# Patient Record
Sex: Male | Born: 1955 | Race: Black or African American | Hispanic: No | Smoking: Current every day smoker
Health system: Southern US, Community
[De-identification: ages and names within clinical notes are randomized; demographics above are authoritative.]

## PROBLEM LIST (undated history)

## (undated) DIAGNOSIS — F209 Schizophrenia, unspecified: Secondary | ICD-10-CM

## (undated) DIAGNOSIS — F32A Depression, unspecified: Secondary | ICD-10-CM

## (undated) DIAGNOSIS — H547 Unspecified visual loss: Secondary | ICD-10-CM

## (undated) DIAGNOSIS — F329 Major depressive disorder, single episode, unspecified: Secondary | ICD-10-CM

## (undated) DIAGNOSIS — F1010 Alcohol abuse, uncomplicated: Secondary | ICD-10-CM

## (undated) DIAGNOSIS — I499 Cardiac arrhythmia, unspecified: Secondary | ICD-10-CM

## (undated) DIAGNOSIS — R51 Headache: Secondary | ICD-10-CM

## (undated) DIAGNOSIS — E119 Type 2 diabetes mellitus without complications: Secondary | ICD-10-CM

## (undated) DIAGNOSIS — B1920 Unspecified viral hepatitis C without hepatic coma: Secondary | ICD-10-CM

## (undated) DIAGNOSIS — J45909 Unspecified asthma, uncomplicated: Secondary | ICD-10-CM

## (undated) DIAGNOSIS — F419 Anxiety disorder, unspecified: Secondary | ICD-10-CM

## (undated) DIAGNOSIS — F200 Paranoid schizophrenia: Secondary | ICD-10-CM

## (undated) DIAGNOSIS — H409 Unspecified glaucoma: Secondary | ICD-10-CM

## (undated) DIAGNOSIS — Z720 Tobacco use: Secondary | ICD-10-CM

## (undated) DIAGNOSIS — H269 Unspecified cataract: Secondary | ICD-10-CM

## (undated) DIAGNOSIS — I10 Essential (primary) hypertension: Secondary | ICD-10-CM

## (undated) HISTORY — PX: TOTAL HIP ARTHROPLASTY: SHX124

## (undated) HISTORY — PX: HIP ARTHROPLASTY: SHX981

## (undated) HISTORY — PX: KNEE CARTILAGE SURGERY: SHX688

## (undated) HISTORY — PX: OTHER SURGICAL HISTORY: SHX169

---

## 2011-11-26 ENCOUNTER — Emergency Department (HOSPITAL_COMMUNITY): Payer: Self-pay

## 2011-11-26 ENCOUNTER — Emergency Department (HOSPITAL_COMMUNITY)
Admission: EM | Admit: 2011-11-26 | Discharge: 2011-11-26 | Disposition: A | Discharge status: Home / Self Care | Payer: Self-pay | Attending: Emergency Medicine | Admitting: Emergency Medicine

## 2011-11-26 ENCOUNTER — Encounter (HOSPITAL_COMMUNITY): Payer: Self-pay | Admitting: *Deleted

## 2011-11-26 DIAGNOSIS — S0083XA Contusion of other part of head, initial encounter: Secondary | ICD-10-CM

## 2011-11-26 DIAGNOSIS — S01501A Unspecified open wound of lip, initial encounter: Secondary | ICD-10-CM | POA: Insufficient documentation

## 2011-11-26 DIAGNOSIS — Z8659 Personal history of other mental and behavioral disorders: Secondary | ICD-10-CM | POA: Insufficient documentation

## 2011-11-26 DIAGNOSIS — R404 Transient alteration of awareness: Secondary | ICD-10-CM | POA: Insufficient documentation

## 2011-11-26 DIAGNOSIS — M25559 Pain in unspecified hip: Secondary | ICD-10-CM | POA: Insufficient documentation

## 2011-11-26 DIAGNOSIS — S0003XA Contusion of scalp, initial encounter: Secondary | ICD-10-CM | POA: Insufficient documentation

## 2011-11-26 DIAGNOSIS — S199XXA Unspecified injury of neck, initial encounter: Secondary | ICD-10-CM | POA: Insufficient documentation

## 2011-11-26 DIAGNOSIS — S0993XA Unspecified injury of face, initial encounter: Secondary | ICD-10-CM | POA: Insufficient documentation

## 2011-11-26 HISTORY — DX: Schizophrenia, unspecified: F20.9

## 2011-11-26 LAB — POCT I-STAT, CHEM 8
BUN: 13 mg/dL (ref 6–23)
Calcium, Ion: 1.08 mmol/L — ABNORMAL LOW (ref 1.12–1.32)
Creatinine, Ser: 1.3 mg/dL (ref 0.50–1.35)
TCO2: 25 mmol/L (ref 0–100)

## 2011-11-26 MED ORDER — HYDROCODONE-ACETAMINOPHEN 5-325 MG PO TABS: 1.0000 | ORAL_TABLET | Freq: Four times a day (QID) | ORAL | Status: AC | PRN

## 2011-11-26 MED ORDER — VITAMIN B-1 100 MG PO TABS
100.0000 mg | ORAL_TABLET | Freq: Once | ORAL | Status: AC
  Administered 2011-11-26: 100 mg via ORAL
  Filled 2011-11-26: qty 1

## 2011-11-26 MED ORDER — SODIUM CHLORIDE 0.9 % IV SOLN
INTRAVENOUS | Status: DC
  Administered 2011-11-26: 21:00:00 via INTRAVENOUS
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
## 2012-05-23 ENCOUNTER — Encounter (HOSPITAL_COMMUNITY): Payer: Self-pay | Admitting: Emergency Medicine

## 2012-05-23 ENCOUNTER — Emergency Department (INDEPENDENT_AMBULATORY_CARE_PROVIDER_SITE_OTHER)
Admission: EM | Admit: 2012-05-23 | Discharge: 2012-05-23 | Disposition: A | Discharge status: Home / Self Care | Payer: Self-pay | Source: Home / Self Care | Attending: Emergency Medicine | Admitting: Emergency Medicine

## 2012-05-23 DIAGNOSIS — M545 Low back pain: Secondary | ICD-10-CM

## 2012-05-23 DIAGNOSIS — M25559 Pain in unspecified hip: Secondary | ICD-10-CM

## 2012-05-23 DIAGNOSIS — M25551 Pain in right hip: Secondary | ICD-10-CM

## 2012-05-23 DIAGNOSIS — M533 Sacrococcygeal disorders, not elsewhere classified: Secondary | ICD-10-CM

## 2012-05-23 MED ORDER — KETOROLAC TROMETHAMINE 60 MG/2ML IM SOLN
60.0000 mg | Freq: Once | INTRAMUSCULAR | Status: AC
  Administered 2012-05-23: 60 mg via INTRAMUSCULAR

## 2012-05-23 MED ORDER — KETOROLAC TROMETHAMINE 60 MG/2ML IM SOLN
INTRAMUSCULAR | Status: AC
  Filled 2012-05-23: qty 2

## 2012-05-23 MED ORDER — MELOXICAM 7.5 MG PO TABS: 7.5000 mg | ORAL_TABLET | Freq: Every day | ORAL | Status: DC

## 2012-05-23 MED ORDER — CYCLOBENZAPRINE HCL 10 MG PO TABS: 10.0000 mg | ORAL_TABLET | Freq: Two times a day (BID) | ORAL | Status: DC | PRN

## 2012-05-23 MED ORDER — TRAMADOL HCL 50 MG PO TABS: 50.0000 mg | ORAL_TABLET | Freq: Four times a day (QID) | ORAL | Status: DC | PRN

## 2012-05-23 NOTE — ED Notes (Signed)
Pt c/o bilateral hip pain and pain on left knee x6 months... Does not have a PCP but is seeing a Dealer... Hip surgery in 2004 and 2006... Pt can't walk/sit for long periods... Denies: fevers, vomiting, nauseas, diarrhea... Pt is alert w/no signs of distress.
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
## 2012-05-24 NOTE — ED Provider Notes (Signed)
Medical screening examination/treatment/procedure(s) were performed by non-physician practitioner and as supervising physician I was immediately available for consultation/collaboration.  Raynald Blend, MD 05/24/12 [phone]

## 2012-06-26 ENCOUNTER — Emergency Department (HOSPITAL_COMMUNITY): Payer: Medicaid Other

## 2012-06-26 ENCOUNTER — Emergency Department (HOSPITAL_COMMUNITY)
Admission: EM | Admit: 2012-06-26 | Discharge: 2012-06-26 | Disposition: A | Discharge status: Home / Self Care | Payer: Medicaid Other | Attending: Emergency Medicine | Admitting: Emergency Medicine

## 2012-06-26 ENCOUNTER — Encounter (HOSPITAL_COMMUNITY): Payer: Self-pay | Admitting: Emergency Medicine

## 2012-06-26 DIAGNOSIS — M7989 Other specified soft tissue disorders: Secondary | ICD-10-CM | POA: Insufficient documentation

## 2012-06-26 DIAGNOSIS — Z9889 Other specified postprocedural states: Secondary | ICD-10-CM | POA: Insufficient documentation

## 2012-06-26 DIAGNOSIS — F209 Schizophrenia, unspecified: Secondary | ICD-10-CM | POA: Insufficient documentation

## 2012-06-26 DIAGNOSIS — M25559 Pain in unspecified hip: Secondary | ICD-10-CM | POA: Insufficient documentation

## 2012-06-26 DIAGNOSIS — Z79899 Other long term (current) drug therapy: Secondary | ICD-10-CM | POA: Insufficient documentation

## 2012-06-26 DIAGNOSIS — F17200 Nicotine dependence, unspecified, uncomplicated: Secondary | ICD-10-CM | POA: Insufficient documentation

## 2012-06-26 DIAGNOSIS — M25551 Pain in right hip: Secondary | ICD-10-CM

## 2012-06-26 MED ORDER — OXYCODONE-ACETAMINOPHEN 5-325 MG PO TABS
1.0000 | ORAL_TABLET | Freq: Once | ORAL | Status: AC
  Administered 2012-06-26: 1 via ORAL
  Filled 2012-06-26: qty 1

## 2012-06-26 MED ORDER — IBUPROFEN 200 MG PO TABS
600.0000 mg | ORAL_TABLET | Freq: Once | ORAL | Status: AC
  Administered 2012-06-26: 600 mg via ORAL
  Filled 2012-06-26: qty 3

## 2012-06-26 MED ORDER — IBUPROFEN 600 MG PO TABS: 600.0000 mg | ORAL_TABLET | Freq: Four times a day (QID) | ORAL | Status: DC | PRN

## 2012-06-26 NOTE — ED Notes (Signed)
[id number]> Expected date:<BR> Expected time:<BR> Means of arrival:<BR> Comments:<BR> Hip pain/ ems

## 2012-06-26 NOTE — ED Notes (Signed)
Per ems_ The patient reports that he has right hip pain without trauma since Friday - able to walk with difficulty

## 2012-06-27 NOTE — ED Provider Notes (Signed)
Medical screening examination/treatment/procedure(s) were performed by non-physician practitioner and as supervising physician I was immediately available for consultation/collaboration.    Ariyanna Oien L Latifah Padin, MD 06/27/12 2244 

## 2012-06-27 NOTE — ED Provider Notes (Signed)
History     CSN: [id number]  Arrival date & time 06/26/12  1039   First MD Initiated Contact with Patient 06/26/12 1056      Chief Complaint  Patient presents with  . Hip Pain    (Consider location/radiation/quality/duration/timing/severity/associated sxs/prior treatment) Patient is a 56 y.o. male presenting with hip pain. The history is provided by the patient and the EMS personnel. No language interpreter was used.  Hip Pain This is a recurrent problem. The current episode started in the past 7 days. The problem occurs daily. The problem has been unchanged. Pertinent negatives include no fever, nausea, neck pain, numbness, urinary symptoms or vomiting. The symptoms are aggravated by walking. He has tried nothing for the symptoms.  56yo male c/o of R hip pain since he went to Urgent Care on 12/4.  Patient was prescribed pain medications but did not get them filled.  Ambulating without difficulty today  But pain is worse with ambulation with radiation to R buttocks and anterior lateral R thigh.    Denies back pain, numbness, weakness, incontinence of urine or stool.    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  States that he did fall one month ago.  pmh schizophrenia.   Past Medical History  Diagnosis Date  . Schizophrenia     Past Surgical History  Procedure Date  . Hip surgery     History reviewed. No pertinent family history.  History  Substance Use Topics  . Smoking status: Current Every Day Smoker -- 0.5 packs/day    Types: Cigarettes  . Smokeless tobacco: Not on file  . Alcohol Use: Yes      Review of Systems  Constitutional: Negative.  Negative for fever.  HENT: Negative.  Negative for neck pain.   Eyes: Negative.   Respiratory: Negative.   Cardiovascular: Positive for leg swelling.  Gastrointestinal: Negative.  Negative for nausea and vomiting.  Musculoskeletal: Negative for back pain and gait problem.       R hip pain  Neurological: Negative.   Negative for numbness.  Psychiatric/Behavioral: Negative.   All other systems reviewed and are negative.    Allergies  Review of patient's allergies indicates no known allergies.  Home Medications   Current Outpatient Rx  Name  Route  Sig  Dispense  Refill  . CYCLOBENZAPRINE HCL 10 MG PO TABS   Oral   Take 10 mg by mouth 2 (two) times daily as needed.         Marland Kitchen DIVALPROEX SODIUM 250 MG PO TBEC   Oral   Take 250 mg by mouth 4 (four) times daily.         . MELOXICAM 7.5 MG PO TABS   Oral   Take 7.5 mg by mouth daily.         . TRAMADOL HCL 50 MG PO TABS   Oral   Take 50 mg by mouth every 6 (six) hours as needed. Pain         . IBUPROFEN 600 MG PO TABS   Oral   Take 1 tablet (600 mg total) by mouth every 6 (six) hours as needed for pain.   30 tablet   0     BP 140/88  Pulse 72  Temp 97.9 F (36.6 C) (Oral)  Resp 18  SpO2 99%  Physical Exam  Nursing note and vitals reviewed. Constitutional: He is oriented to person, place, and time. He appears well-developed and well-nourished.  HENT:  Head: Normocephalic.  Eyes: Conjunctivae normal and EOM are normal. Pupils are equal, round, and reactive to light.  Neck: Normal range of motion. Neck supple.  Cardiovascular: Normal rate.   Pulmonary/Chest: Effort normal.  Abdominal: Soft.  Musculoskeletal: Normal range of motion. He exhibits tenderness. He exhibits no edema.       R buttocks/hip tenderness.  +CMS below pain  Neurological: He is alert and oriented to person, place, and time.  Skin: Skin is warm and dry.  Psychiatric: He has a normal mood and affect.    ED Course  Procedures (including critical care time)  Labs Reviewed - No data to display Dg Hip Complete Right  06/26/2012  *RADIOLOGY REPORT*  Clinical Data: Hip pain  RIGHT HIP - COMPLETE 2+ VIEW  Comparison: None  Findings: Three views of the right hip submitted.  No acute fracture or subluxation.  There is bilateral hip prosthesis in anatomic  alignment.  No evidence of loosening of the right hip prosthesis.  IMPRESSION: No acute fracture or subluxation.  Bilateral hip prosthesis in anatomic alignment.   Original Report Authenticated By: Natasha Mead, M.D.      1. Right hip pain       MDM  R hip pain with sciatica.  Did not fill rx for pain on 12/4.  He will use ibuprofen if he does not get his mobic filled.  Instructed to get rx filled from 12/4 and use ice.  Follow up with pcp/ortho as needed. . No cauda equina symptoms or weakness. R hip film unremarkable and reviewed by myself.         Remi Haggard, NP 06/27/12 1428

## 2012-08-11 ENCOUNTER — Emergency Department (HOSPITAL_COMMUNITY)
Admission: EM | Admit: 2012-08-11 | Discharge: 2012-08-11 | Disposition: A | Discharge status: Home / Self Care | Payer: Medicaid Other | Attending: Emergency Medicine | Admitting: Emergency Medicine

## 2012-08-11 ENCOUNTER — Encounter (HOSPITAL_COMMUNITY): Payer: Self-pay | Admitting: *Deleted

## 2012-08-11 DIAGNOSIS — M79609 Pain in unspecified limb: Secondary | ICD-10-CM | POA: Insufficient documentation

## 2012-08-11 DIAGNOSIS — X58XXXA Exposure to other specified factors, initial encounter: Secondary | ICD-10-CM | POA: Insufficient documentation

## 2012-08-11 DIAGNOSIS — Z79899 Other long term (current) drug therapy: Secondary | ICD-10-CM | POA: Insufficient documentation

## 2012-08-11 DIAGNOSIS — F209 Schizophrenia, unspecified: Secondary | ICD-10-CM | POA: Insufficient documentation

## 2012-08-11 DIAGNOSIS — Z96649 Presence of unspecified artificial hip joint: Secondary | ICD-10-CM | POA: Insufficient documentation

## 2012-08-11 DIAGNOSIS — Y929 Unspecified place or not applicable: Secondary | ICD-10-CM | POA: Insufficient documentation

## 2012-08-11 DIAGNOSIS — Z9889 Other specified postprocedural states: Secondary | ICD-10-CM | POA: Insufficient documentation

## 2012-08-11 DIAGNOSIS — M7989 Other specified soft tissue disorders: Secondary | ICD-10-CM

## 2012-08-11 DIAGNOSIS — Y939 Activity, unspecified: Secondary | ICD-10-CM | POA: Insufficient documentation

## 2012-08-11 DIAGNOSIS — S39012A Strain of muscle, fascia and tendon of lower back, initial encounter: Secondary | ICD-10-CM

## 2012-08-11 DIAGNOSIS — S335XXA Sprain of ligaments of lumbar spine, initial encounter: Secondary | ICD-10-CM | POA: Insufficient documentation

## 2012-08-11 DIAGNOSIS — F17200 Nicotine dependence, unspecified, uncomplicated: Secondary | ICD-10-CM | POA: Insufficient documentation

## 2012-08-11 DIAGNOSIS — M79669 Pain in unspecified lower leg: Secondary | ICD-10-CM

## 2012-08-11 DIAGNOSIS — G8929 Other chronic pain: Secondary | ICD-10-CM | POA: Insufficient documentation

## 2012-08-11 MED ORDER — PREDNISONE 50 MG PO TABS: 50.0000 mg | ORAL_TABLET | Freq: Every day | ORAL | Status: DC

## 2012-08-11 MED ORDER — HYDROCODONE-ACETAMINOPHEN 5-325 MG PO TABS: 1.0000 | ORAL_TABLET | Freq: Four times a day (QID) | ORAL | Status: DC | PRN

## 2012-08-11 NOTE — Progress Notes (Signed)
VASCULAR LAB PRELIMINARY  PRELIMINARY  PRELIMINARY  PRELIMINARY  Left lower extremity venous duplex completed.    Preliminary report:  Left:  No evidence of DVT, superficial thrombosis, or Baker's cyst.  Aijalon Demuro, RVS 08/11/2012, 12:19 PM

## 2012-08-11 NOTE — ED Notes (Signed)
Pt reports L calf/leg pain with lower back pain x1 month. Sts pain has gotten worse recently, no new change today. Sts "it hurts when I stand for more than 15 min or sit for a long time." Pain in calf worse with walking as well.

## 2012-08-12 NOTE — ED Provider Notes (Signed)
History     CSN: [id number]  Arrival date & time 08/11/12  4401   First MD Initiated Contact with Patient 08/11/12 [phone]      Chief Complaint  Patient presents with  . Back Pain  . Leg Pain    (Consider location/radiation/quality/duration/timing/severity/associated sxs/prior treatment) HPI Patient presents to the emergency department with lower left back pain and also Pain with lower leg swelling on the left.  Patient states that this began 2 days, ago.  Patient denies shortness of breath, chest pain, nausea, vomiting, weakness, neck pain, or headache.  Patient states that his main complaint is his leg swelling and calf pain.  Patient states that nothing seems to make his condition, better or worse.  Palpation, however, does make the pain, increased. Past Medical History  Diagnosis Date  . Schizophrenia   . Chronic pain     Past Surgical History  Procedure Date  . Hip surgery   . Knee cartilage surgery   . Joint replacement     bil hips    No family history on file.  History  Substance Use Topics  . Smoking status: Current Every Day Smoker -- 0.5 packs/day    Types: Cigarettes  . Smokeless tobacco: Not on file  . Alcohol Use: No      Review of Systems All other systems negative except as documented in the HPI. All pertinent positives and negatives as reviewed in the HPI. Allergies  Review of patient's allergies indicates no known allergies.  Home Medications   Current Outpatient Rx  Name  Route  Sig  Dispense  Refill  . CYCLOBENZAPRINE HCL 10 MG PO TABS   Oral   Take 10 mg by mouth 2 (two) times daily as needed. For pain         . DIVALPROEX SODIUM ER 500 MG PO TB24   Oral   Take 2,000 mg by mouth daily.         . QUETIAPINE FUMARATE ER 300 MG PO TB24   Oral   Take 600 mg by mouth at bedtime.         . TRAMADOL HCL 50 MG PO TABS   Oral   Take 50 mg by mouth every 6 (six) hours as needed. Pain         . HYDROCODONE-ACETAMINOPHEN 5-325 MG PO  TABS   Oral   Take 1 tablet by mouth every 6 (six) hours as needed for pain.   15 tablet   0   . PREDNISONE 50 MG PO TABS   Oral   Take 1 tablet (50 mg total) by mouth daily.   5 tablet   0     BP 139/80  Pulse 94  Temp 98.4 F (36.9 C) (Oral)  Resp 14  SpO2 98%  Physical Exam  Nursing note and vitals reviewed. Constitutional: He appears well-developed and well-nourished. No distress.  Neck: Normal range of motion. Neck supple.  Cardiovascular: Normal rate, regular rhythm and normal heart sounds.  Exam reveals no gallop and no friction rub.   No murmur heard. Pulmonary/Chest: Effort normal and breath sounds normal.  Musculoskeletal:       Lumbar back: He exhibits tenderness and pain. He exhibits no deformity.       Back:       Left lower leg: He exhibits tenderness and edema.  Skin: Skin is warm and dry. No rash noted.    ED Course  Procedures (including critical care time)  Labs Reviewed -  No data to display No results found.   1. Calf pain   2. Lumbar strain     Patient has no DVT noted on ultrasound.  Patient to be treated for calf strain and lumbar strain.  Patient is told to return here for any worsening in his condition.  MDM  *        Carlyle Dolly, PA-C 08/12/12 1546

## 2012-08-13 NOTE — ED Provider Notes (Signed)
Medical screening examination/treatment/procedure(s) were performed by non-physician practitioner and as supervising physician I was immediately available for consultation/collaboration.   Cidney Kirkwood, MD 08/13/12 1419 

## 2012-10-04 ENCOUNTER — Emergency Department (HOSPITAL_COMMUNITY): Payer: Medicaid Other

## 2012-10-04 ENCOUNTER — Inpatient Hospital Stay (HOSPITAL_COMMUNITY): Payer: Medicaid Other

## 2012-10-04 ENCOUNTER — Encounter (HOSPITAL_COMMUNITY): Payer: Self-pay | Admitting: *Deleted

## 2012-10-04 ENCOUNTER — Inpatient Hospital Stay (HOSPITAL_COMMUNITY)
Admission: EM | Admit: 2012-10-04 | Discharge: 2012-10-06 | DRG: 638 | Disposition: A | Discharge status: Home / Self Care | Payer: Medicaid Other | Attending: Internal Medicine | Admitting: Internal Medicine

## 2012-10-04 DIAGNOSIS — Z79899 Other long term (current) drug therapy: Secondary | ICD-10-CM

## 2012-10-04 DIAGNOSIS — E872 Acidosis: Secondary | ICD-10-CM | POA: Diagnosis present

## 2012-10-04 DIAGNOSIS — Z720 Tobacco use: Secondary | ICD-10-CM

## 2012-10-04 DIAGNOSIS — F411 Generalized anxiety disorder: Secondary | ICD-10-CM | POA: Diagnosis present

## 2012-10-04 DIAGNOSIS — E1010 Type 1 diabetes mellitus with ketoacidosis without coma: Principal | ICD-10-CM | POA: Diagnosis present

## 2012-10-04 DIAGNOSIS — H547 Unspecified visual loss: Secondary | ICD-10-CM

## 2012-10-04 DIAGNOSIS — I10 Essential (primary) hypertension: Secondary | ICD-10-CM | POA: Diagnosis present

## 2012-10-04 DIAGNOSIS — Z5900 Homelessness unspecified: Secondary | ICD-10-CM

## 2012-10-04 DIAGNOSIS — R300 Dysuria: Secondary | ICD-10-CM | POA: Diagnosis present

## 2012-10-04 DIAGNOSIS — F3289 Other specified depressive episodes: Secondary | ICD-10-CM | POA: Diagnosis present

## 2012-10-04 DIAGNOSIS — K5900 Constipation, unspecified: Secondary | ICD-10-CM | POA: Diagnosis present

## 2012-10-04 DIAGNOSIS — E1110 Type 2 diabetes mellitus with ketoacidosis without coma: Secondary | ICD-10-CM | POA: Diagnosis present

## 2012-10-04 DIAGNOSIS — Z9181 History of falling: Secondary | ICD-10-CM

## 2012-10-04 DIAGNOSIS — H269 Unspecified cataract: Secondary | ICD-10-CM

## 2012-10-04 DIAGNOSIS — H409 Unspecified glaucoma: Secondary | ICD-10-CM

## 2012-10-04 DIAGNOSIS — Z96649 Presence of unspecified artificial hip joint: Secondary | ICD-10-CM

## 2012-10-04 DIAGNOSIS — F17200 Nicotine dependence, unspecified, uncomplicated: Secondary | ICD-10-CM | POA: Diagnosis present

## 2012-10-04 DIAGNOSIS — F329 Major depressive disorder, single episode, unspecified: Secondary | ICD-10-CM | POA: Diagnosis present

## 2012-10-04 DIAGNOSIS — F200 Paranoid schizophrenia: Secondary | ICD-10-CM

## 2012-10-04 DIAGNOSIS — R739 Hyperglycemia, unspecified: Secondary | ICD-10-CM

## 2012-10-04 HISTORY — DX: Unspecified cataract: H26.9

## 2012-10-04 HISTORY — DX: Anxiety disorder, unspecified: F41.9

## 2012-10-04 HISTORY — DX: Unspecified visual loss: H54.7

## 2012-10-04 HISTORY — DX: Paranoid schizophrenia: F20.0

## 2012-10-04 HISTORY — DX: Essential (primary) hypertension: I10

## 2012-10-04 HISTORY — DX: Type 2 diabetes mellitus without complications: E11.9

## 2012-10-04 HISTORY — DX: Tobacco use: Z72.0

## 2012-10-04 HISTORY — DX: Major depressive disorder, single episode, unspecified: F32.9

## 2012-10-04 HISTORY — DX: Depression, unspecified: F32.A

## 2012-10-04 HISTORY — DX: Headache: R51

## 2012-10-04 HISTORY — DX: Cardiac arrhythmia, unspecified: I49.9

## 2012-10-04 HISTORY — DX: Unspecified glaucoma: H40.9

## 2012-10-04 HISTORY — DX: Unspecified asthma, uncomplicated: J45.909

## 2012-10-04 LAB — BASIC METABOLIC PANEL
BUN: 14 mg/dL (ref 6–23)
BUN: 12 mg/dL (ref 6–23)
BUN: 10 mg/dL (ref 6–23)
CO2: 20 mEq/L (ref 19–32)
CO2: 25 mEq/L (ref 19–32)
CO2: 22 mEq/L (ref 19–32)
CO2: 22 mEq/L (ref 19–32)
Chloride: 93 mEq/L — ABNORMAL LOW (ref 96–112)
Chloride: 100 mEq/L (ref 96–112)
Chloride: 98 mEq/L (ref 96–112)
Chloride: 99 mEq/L (ref 96–112)
Chloride: 99 mEq/L (ref 96–112)
Creatinine, Ser: 0.91 mg/dL (ref 0.50–1.35)
Creatinine, Ser: 0.9 mg/dL (ref 0.50–1.35)
GFR calc Af Amer: >90 mL/min (ref >90)
GFR calc Af Amer: >90 mL/min (ref >90)
GFR calc Af Amer: >90 mL/min (ref >90)
GFR calc non Af Amer: >90 mL/min (ref >90)
Glucose, Bld: 406 mg/dL — ABNORMAL HIGH (ref 70–99)
Glucose, Bld: 159 mg/dL — ABNORMAL HIGH (ref 70–99)
Glucose, Bld: 158 mg/dL — ABNORMAL HIGH (ref 70–99)
Glucose, Bld: 203 mg/dL — ABNORMAL HIGH (ref 70–99)
Potassium: 3.9 mEq/L (ref 3.5–5.1)
Potassium: 3.4 mEq/L — ABNORMAL LOW (ref 3.5–5.1)
Potassium: 3.9 mEq/L (ref 3.5–5.1)
Potassium: 3.2 mEq/L — ABNORMAL LOW (ref 3.5–5.1)
Sodium: 139 mEq/L (ref 135–145)
Sodium: 137 mEq/L (ref 135–145)
Sodium: 137 mEq/L (ref 135–145)

## 2012-10-04 LAB — URINALYSIS, ROUTINE W REFLEX MICROSCOPIC
Hgb urine dipstick: NEGATIVE
Nitrite: NEGATIVE
Specific Gravity, Urine: 1.039 — ABNORMAL HIGH (ref 1.005–1.030)
Urobilinogen, UA: 0.2 mg/dL (ref 0.0–1.0)
pH: 5 (ref 5.0–8.0)

## 2012-10-04 LAB — COMPREHENSIVE METABOLIC PANEL
ALT: 31 U/L (ref 0–53)
AST: 30 U/L (ref 0–37)
Alkaline Phosphatase: 103 U/L (ref 39–117)
CO2: 17 mEq/L — ABNORMAL LOW (ref 19–32)
Calcium: 9.3 mg/dL (ref 8.4–10.5)
GFR calc non Af Amer: 75 mL/min — ABNORMAL LOW (ref >90)
Potassium: 3.9 mEq/L (ref 3.5–5.1)
Sodium: 129 mEq/L — ABNORMAL LOW (ref 135–145)
Total Protein: 7.9 g/dL (ref 6.0–8.3)

## 2012-10-04 LAB — CBC WITH DIFFERENTIAL/PLATELET
Basophils Absolute: 0 10*3/uL (ref 0.0–0.1)
Eosinophils Relative: 1 % (ref 0–5)
Lymphocytes Relative: 30 % (ref 12–46)
MCV: 88 fL (ref 78.0–100.0)
Neutrophils Relative %: 60 % (ref 43–77)
Platelets: 183 10*3/uL (ref 150–400)
RBC: 4.59 MIL/uL (ref 4.22–5.81)
RDW: 12.5 % (ref 11.5–15.5)
WBC: 4.4 10*3/uL (ref 4.0–10.5)

## 2012-10-04 LAB — CBC
HCT: 36.9 % — ABNORMAL LOW (ref 39.0–52.0)
Hemoglobin: 14 g/dL (ref 13.0–17.0)
MCH: 32.3 pg (ref 26.0–34.0)
MCHC: 37.9 g/dL — ABNORMAL HIGH (ref 30.0–36.0)
MCV: 85 fL (ref 78.0–100.0)
RBC: 4.34 MIL/uL (ref 4.22–5.81)

## 2012-10-04 LAB — GLUCOSE, CAPILLARY
Glucose-Capillary: 548 mg/dL — ABNORMAL HIGH (ref 70–99)
Glucose-Capillary: 443 mg/dL — ABNORMAL HIGH (ref 70–99)
Glucose-Capillary: 371 mg/dL — ABNORMAL HIGH (ref 70–99)
Glucose-Capillary: 315 mg/dL — ABNORMAL HIGH (ref 70–99)
Glucose-Capillary: 146 mg/dL — ABNORMAL HIGH (ref 70–99)
Glucose-Capillary: 168 mg/dL — ABNORMAL HIGH (ref 70–99)
Glucose-Capillary: 176 mg/dL — ABNORMAL HIGH (ref 70–99)
Glucose-Capillary: 220 mg/dL — ABNORMAL HIGH (ref 70–99)

## 2012-10-04 LAB — RAPID URINE DRUG SCREEN, HOSP PERFORMED: Opiates: NOT DETECTED

## 2012-10-04 LAB — KETONES, QUALITATIVE

## 2012-10-04 LAB — OSMOLALITY: Osmolality: 310 mOsm/kg — ABNORMAL HIGH (ref 275–300)

## 2012-10-04 LAB — POCT I-STAT 3, VENOUS BLOOD GAS (G3P V)
Bicarbonate: 19.8 mEq/L — ABNORMAL LOW (ref 20.0–24.0)
pH, Ven: 7.341 — ABNORMAL HIGH (ref 7.250–7.300)
pO2, Ven: 149 mmHg — ABNORMAL HIGH (ref 30.0–45.0)

## 2012-10-04 LAB — PHOSPHORUS: Phosphorus: 2.3 mg/dL (ref 2.3–4.6)

## 2012-10-04 LAB — ETHANOL: Alcohol, Ethyl (B): <11 mg/dL (ref 0–11)

## 2012-10-04 LAB — LACTIC ACID, PLASMA: Lactic Acid, Venous: 1.6 mmol/L (ref 0.5–2.2)

## 2012-10-04 MED ORDER — POTASSIUM CHLORIDE 10 MEQ/100ML IV SOLN
10.0000 meq | INTRAVENOUS | Status: AC
  Administered 2012-10-04 – 2012-10-05 (×4): 10 meq via INTRAVENOUS
  Filled 2012-10-04 (×2): qty 400

## 2012-10-04 MED ORDER — SODIUM CHLORIDE 0.9 % IV BOLUS (SEPSIS)
1000.0000 mL | Freq: Once | INTRAVENOUS | Status: AC
  Administered 2012-10-04: 1000 mL via INTRAVENOUS

## 2012-10-04 MED ORDER — SENNOSIDES-DOCUSATE SODIUM 8.6-50 MG PO TABS
1.0000 | ORAL_TABLET | Freq: Two times a day (BID) | ORAL | Status: DC
  Administered 2012-10-05 – 2012-10-06 (×3): 1 via ORAL
  Filled 2012-10-04 (×5): qty 1

## 2012-10-04 MED ORDER — FLUOXETINE HCL 20 MG PO CAPS
20.0000 mg | ORAL_CAPSULE | Freq: Every day | ORAL | Status: DC
  Administered 2012-10-04 – 2012-10-06 (×3): 20 mg via ORAL
  Filled 2012-10-04 (×3): qty 1

## 2012-10-04 MED ORDER — SODIUM CHLORIDE 0.9 % IV SOLN
INTRAVENOUS | Status: DC
  Administered 2012-10-04: 13:00:00 via INTRAVENOUS

## 2012-10-04 MED ORDER — SODIUM CHLORIDE 0.9 % IV SOLN: INTRAVENOUS | Status: DC

## 2012-10-04 MED ORDER — SODIUM CHLORIDE 0.9 % IV SOLN
INTRAVENOUS | Status: DC
  Administered 2012-10-04: 4.9 [IU]/h via INTRAVENOUS
  Administered 2012-10-04: 7.7 [IU]/h via INTRAVENOUS
  Filled 2012-10-04: qty 1

## 2012-10-04 MED ORDER — DIVALPROEX SODIUM ER 500 MG PO TB24
2000.0000 mg | ORAL_TABLET | Freq: Every day | ORAL | Status: DC
  Administered 2012-10-04 – 2012-10-05 (×2): 2000 mg via ORAL
  Filled 2012-10-04 (×4): qty 4

## 2012-10-04 MED ORDER — POTASSIUM CHLORIDE CRYS ER 20 MEQ PO TBCR: 40.0000 meq | EXTENDED_RELEASE_TABLET | ORAL | Status: DC

## 2012-10-04 MED ORDER — DEXTROSE-NACL 5-0.45 % IV SOLN: INTRAVENOUS | Status: DC

## 2012-10-04 MED ORDER — ENOXAPARIN SODIUM 40 MG/0.4ML ~~LOC~~ SOLN
40.0000 mg | SUBCUTANEOUS | Status: DC
  Administered 2012-10-04 – 2012-10-05 (×2): 40 mg via SUBCUTANEOUS
  Filled 2012-10-04 (×4): qty 0.4

## 2012-10-04 MED ORDER — POTASSIUM CHLORIDE 10 MEQ/100ML IV SOLN
10.0000 meq | INTRAVENOUS | Status: AC
  Administered 2012-10-04 (×2): 10 meq via INTRAVENOUS
  Filled 2012-10-04 (×2): qty 100

## 2012-10-04 MED ORDER — INSULIN REGULAR BOLUS VIA INFUSION
0.0000 [IU] | Freq: Three times a day (TID) | INTRAVENOUS | Status: DC
  Filled 2012-10-04: qty 10

## 2012-10-04 MED ORDER — DEXTROSE-NACL 5-0.45 % IV SOLN
INTRAVENOUS | Status: DC
  Administered 2012-10-04: 18:00:00 via INTRAVENOUS

## 2012-10-04 MED ORDER — DEXTROSE 50 % IV SOLN: 25.0000 mL | INTRAVENOUS | Status: DC | PRN

## 2012-10-04 MED ORDER — SODIUM CHLORIDE 0.9 % IV SOLN
INTRAVENOUS | Status: DC
  Filled 2012-10-04: qty 1

## 2012-10-04 MED ORDER — DEXTROSE-NACL 5-0.9 % IV SOLN
INTRAVENOUS | Status: DC
  Administered 2012-10-04 – 2012-10-05 (×2): via INTRAVENOUS

## 2012-10-04 MED ORDER — POLYVINYL ALCOHOL 1.4 % OP SOLN
1.0000 [drp] | OPHTHALMIC | Status: DC | PRN
  Administered 2012-10-04: 1 [drp] via OPHTHALMIC
  Filled 2012-10-04: qty 15

## 2012-10-04 MED ORDER — ONDANSETRON HCL 4 MG/2ML IJ SOLN
4.0000 mg | Freq: Once | INTRAMUSCULAR | Status: AC
  Administered 2012-10-04: 4 mg via INTRAVENOUS
  Filled 2012-10-04: qty 2

## 2012-10-04 MED ORDER — QUETIAPINE FUMARATE ER 300 MG PO TB24
600.0000 mg | ORAL_TABLET | Freq: Every day | ORAL | Status: DC
  Administered 2012-10-04 – 2012-10-05 (×2): 600 mg via ORAL
  Filled 2012-10-04 (×5): qty 2

## 2012-10-04 NOTE — ED Provider Notes (Signed)
History     CSN: [id number]  Arrival date & time 10/04/12  4782   First MD Initiated Contact with Patient 10/04/12 0930      Chief Complaint  Patient presents with  . Hyperglycemia    (Consider location/radiation/quality/duration/timing/severity/associated sxs/prior treatment) HPI Comments: 57 y/o make with a history of paranoid schizophrenia presents to the ED complaining of feeling weak and nauseated x 1 week. He went to [facility] yesterday and cannot remember if he was told he had low or high blood sugar. No hx of diabetes. Admits to vomiting once yesterday. Feels as if his abdomen is distended but denies abdominal pain. Had diarrhea for a few days until last night when he had a small hard BM. Admits to polyuria and polydipsia. He has been feeling confused and "just not right". Denies alcohol or drug use.  The history is provided by the patient.    Past Medical History  Diagnosis Date  . Schizophrenia   . Chronic pain     Past Surgical History  Procedure Laterality Date  . Hip surgery    . Knee cartilage surgery    . Joint replacement      bil hips    No family history on file.  History  Substance Use Topics  . Smoking status: Current Every Day Smoker -- 0.50 packs/day    Types: Cigarettes  . Smokeless tobacco: Not on file  . Alcohol Use: No      Review of Systems  Constitutional: Positive for appetite change.  Eyes: Negative for visual disturbance.  Respiratory: Negative for shortness of breath.   Cardiovascular: Negative for chest pain.  Gastrointestinal: Positive for nausea, vomiting, diarrhea and abdominal distention. Negative for abdominal pain and blood in stool.  Endocrine: Positive for polydipsia and polyuria.  Genitourinary: Positive for frequency.  Musculoskeletal: Negative for back pain.  Neurological: Positive for weakness and headaches.  Psychiatric/Behavioral: Positive for confusion.  All other systems reviewed and are negative.    Allergies   Review of patient's allergies indicates no known allergies.  Home Medications   Current Outpatient Rx  Name  Route  Sig  Dispense  Refill  . cyclobenzaprine (FLEXERIL) 10 MG tablet   Oral   Take 10 mg by mouth 2 (two) times daily as needed. For pain         . divalproex (DEPAKOTE ER) 500 MG 24 hr tablet   Oral   Take 2,000 mg by mouth daily.         Marland Kitchen HYDROcodone-acetaminophen (NORCO/VICODIN) 5-325 MG per tablet   Oral   Take 1 tablet by mouth every 6 (six) hours as needed for pain.   15 tablet   0   . predniSONE (DELTASONE) 50 MG tablet   Oral   Take 1 tablet (50 mg total) by mouth daily.   5 tablet   0   . QUEtiapine (SEROQUEL XR) 300 MG 24 hr tablet   Oral   Take 600 mg by mouth at bedtime.         . traMADol (ULTRAM) 50 MG tablet   Oral   Take 50 mg by mouth every 6 (six) hours as needed. Pain           BP 159/90  Pulse 100  Temp(Src) 98 F (36.7 C) (Oral)  Resp 22  Ht 6' (1.829 m)  Wt 180 lb (81.647 kg)  BMI 24.41 kg/m2  SpO2 98%  Physical Exam  Nursing note and vitals reviewed. Constitutional: He is oriented  to person, place, and time. He appears well-developed and well-nourished.  Appears uncomfortable.  HENT:  Head: Normocephalic and atraumatic.  Mouth/Throat: Oropharynx is clear and moist.  Eyes: Conjunctivae and EOM are normal. Pupils are equal, round, and reactive to light. No scleral icterus.  Neck: Normal range of motion. Neck supple. No JVD present. No tracheal deviation present.  Cardiovascular: Regular rhythm, normal heart sounds and intact distal pulses.  Tachycardia present.   No extremity edema.  Pulmonary/Chest: Breath sounds normal. Tachypnea noted. No respiratory distress. He has no decreased breath sounds. He has no wheezes. He has no rhonchi. He has no rales.  Abdominal: Soft. Bowel sounds are normal. He exhibits distension. There is generalized tenderness. There is guarding. There is no rigidity and no rebound.  No  peritoneal signs.  Musculoskeletal: Normal range of motion. He exhibits no edema.  Neurological: He is alert and oriented to person, place, and time. He has normal strength. No sensory deficit. Gait normal.  Skin: Skin is warm and dry. He is not diaphoretic.  Psychiatric: He has a normal mood and affect. His speech is normal and behavior is normal. Thought content normal.    ED Course  Procedures (including critical care time)  Labs Reviewed  GLUCOSE, CAPILLARY - Abnormal; Notable for the following:    Glucose-Capillary >600 (*)    All other components within normal limits  CBC WITH DIFFERENTIAL - Abnormal; Notable for the following:    MCHC 36.6 (*)    All other components within normal limits  COMPREHENSIVE METABOLIC PANEL - Abnormal; Notable for the following:    Sodium 129 (*)    Chloride 86 (*)    CO2 17 (*)    Glucose, Bld 648 (*)    GFR calc non Af Amer 75 (*)    GFR calc Af Amer 87 (*)    All other components within normal limits  GLUCOSE, CAPILLARY - Abnormal; Notable for the following:    Glucose-Capillary 548 (*)    All other components within normal limits  POCT I-STAT 3, BLOOD GAS (G3P V) - Abnormal; Notable for the following:    pH, Ven 7.341 (*)    pCO2, Ven 36.6 (*)    pO2, Ven 149.0 (*)    Bicarbonate 19.8 (*)    Acid-base deficit 5.0 (*)    All other components within normal limits  LIPASE, BLOOD  AMMONIA   Dg Abd Acute W/chest  10/04/2012  *RADIOLOGY REPORT*  Clinical Data: Abdominal pain.  Vomiting.  Hypertension.  Diabetes.  ACUTE ABDOMEN SERIES (ABDOMEN 2 VIEW & CHEST 1 VIEW)  Comparison: None.  Findings: Cardiac and mediastinal contours appear normal.  The lungs appear clear.  No pleural effusion is identified.  No free peroneal gas is evident beneath the hemidiaphragms.  Prominence of stool throughout the colon suggests constipation.  No dilated bowel identified.  No significant abnormal air-fluid levels.  Bilateral hip prosthesis are partially  visualized.  IMPRESSION:  1. Prominence of stool throughout the colon suggests constipation.   Original Report Authenticated By: Gaylyn Rong, M.D.      1. Hyperglycemia   2. DKA (diabetic ketoacidoses)       MDM  57 year old male with hyperglycemia diabetic ketoacidosis. He is in no apparent distress at this time. Anion gap of 26. Gluco-stabilizer started. He will be admitted to internal medicine teaching service. Case discussed with Dr. Ignacia Palma who agrees with plan of care.       Trevor Mace, PA-C 10/04/12 1119

## 2012-10-04 NOTE — ED Notes (Signed)
Patient transported to X-ray 

## 2012-10-04 NOTE — ED Notes (Signed)
Consulting MD at bedside

## 2012-10-04 NOTE — ED Provider Notes (Signed)
Medical screening examination/treatment/procedure(s) were conducted as a shared visit with non-physician practitioner(s) and myself.  I personally evaluated the patient during the encounter 57 yo man with weakness and nausea, found to have blood glucose over 600.  Newly diagnosed diabetic.  Being admitted to Internal Medicine Teaching Service.  Carleene Cooper III, MD 10/04/12 1145

## 2012-10-04 NOTE — H&P (Signed)
Date: 10/04/2012                Patient Name:  Victor Savage  MRN: [id number]   DOB: March 09, 1956  Age / Sex: 57 y.o., male   PCP: Provider Default, MD              Medical Service: Internal Medicine Teaching Service              Attending Physician: Dr. Criselda Peaches    First Contact: Dr. Shirlee Latch Pager: [phone]  Second Contact: Dr. Everardo Beals Pager: [phone]            After Hours (After 5pm / weekends / holidays): First Contact Second Contact Pager: Pager: [phone]        Chief Complaint: generalized malaise, polyuria, polydipsia, nausea  History of Present Illness: Patient is a 57 y.o. male with a PMHx of paranoid schizophrenia, who presents to [facility] for evaluation of generalized malaise, polyuria, polydipsia, craving for sweets, positional lightheadedness x 1 month, that has been worst over last 1 week. Patient also describes associated BL LQ, sharp, intermittent abdominal pain that is nonradiating in character. Confirms dysuria without associated hematuria, flank pain. Denies associated penile discharge, lesions, swelling. Aggravating factors include: caffeine.  Alleviating factors include: urination. Last BM last night, typically has BM once daily. Associated symptoms include: nausea and vomiting (nonbloody, nonbilious vomitus x 3 days occuring once daily), dysuria x 2 weeks, dry mouth. The patient denies chills, constipation, diarrhea, hematochezia and hematuria.   Review of Systems: Constitutional:  admits to decreased appetite, and increased craving for sweets, fatigue.  Denies fever, chills, diaphoresis.  HEENT: admits to resolving sore throat. Denies eye pain, redness, hearing loss, ear pain, congestion, rhinorrhea.  Respiratory: admits to dry cough x 1 week.  Denies SOB, DOE, chest tightness, and wheezing.  Cardiovascular: admits to palpitations. Denies chest pain and leg swelling.  Gastrointestinal: admits to nausea, vomiting, abdominal pain. Denies diarrhea,  constipation, blood in stool.  Genitourinary: admits to dysuria. Denies urgency, frequency, hematuria, flank pain and difficulty urinating.  Musculoskeletal: denies myalgias, back pain, joint swelling, arthralgias and gait problem.   Skin: denies pallor, rash and wound.  Neurological: admits to positional lightheadedness. Denies dizziness, seizures, syncope, weakness, numbness and headaches.   Hematological: denies easy bruising, personal or family bleeding history.  Psychiatric: admits to claustrophobia, easy agitation. Denies suicidal ideation / homicidal ideation.     Current Outpatient Medications: Medication Sig  . divalproex (DEPAKOTE ER) 500 MG 24 hr tablet Take 2,000 mg by mouth at bedtime.   Marland Kitchen FLUoxetine (PROZAC) 20 MG capsule Take 20 mg by mouth daily.  . QUEtiapine (SEROQUEL XR) 300 MG 24 hr tablet Take 600 mg by mouth at bedtime.    Allergies: No Known Allergies    Past Medical History: Past Medical History  Diagnosis Date  . Schizophrenia, paranoid type     follows at [facility]  . Glaucoma   . Cataract     bilateral  . Decreased visual acuity     can only see well with right eye    Past Surgical History: Past Surgical History  Procedure Laterality Date  . Total hip arthroplasty Bilateral 2004, 2006    2/2 injury from fall  . Knee cartilage surgery Right     Family History: Family History  Problem Relation Age of Onset  . Coronary artery disease Sister     Social History: History   Social History  . Marital Status: Divorced    Spouse  Name: N/A    Number of Children: 1  . Years of Education: GED   Occupational History  . Unemployed    Social History Main Topics  . Smoking status: Current Every Day Smoker -- 0.50 packs/day for 40 years    Types: Cigarettes  . Smokeless tobacco: Never Used  . Alcohol Use: No  . Drug Use: No     Comment: previously used to snort cocaine in 1990s, remote THC useage  . Sexually Active: Not on file   Other  Topics Concern  . Not on file   Social History Narrative   Homeless almost all of his life. Was incarcerated in 2012-2013 x8 mo for assault - released April 2013.    Can read and write minimally.   Has food stamps.     Vital Signs: Blood pressure 159/90, pulse 100, temperature 98 F (36.7 C), temperature source Oral, resp. rate 22, height 6' (1.829 m), weight 180 lb (81.647 kg), SpO2 98.00%.  Physical Exam: General: Vital signs reviewed and noted. Well-developed, well-nourished, in no acute distress; alert, appropriate and cooperative throughout examination.  Head: Normocephalic, atraumatic.  Eyes: PERRL, EOMI, No signs of anemia or jaundince.  Nose: Mucous membranes moist, not inflammed, nonerythematous.  Throat: Oropharynx nonerythematous, no exudate appreciated. Poor dentition with right lower posterior molars with caries. Dry mucous membranes.  Neck: No deformities, masses, or tenderness noted. Supple,  no JVD.  Lungs:  Normal respiratory effort. Clear to auscultation BL without crackles or wheezes.  Heart: RRR. S1 and S2 normal without gallop, murmur, or rubs.  Abdomen:  BS normoactive. Soft, Nondistended, non-tender.  No masses or organomegaly.  Extremities: No pretibial edema.  Neurologic: A&O X3, CN II - XII are grossly intact. Motor strength is 5/5 in the all 4 extremities, Sensations intact to light touch, Cerebellar signs negative.  Skin: No visible rashes, scars. Dry skin.    Lab results:  CURRENT LABS: CBC:    Component Value Date/Time   WBC 4.4 10/04/2012 1005   HGB 14.8 10/04/2012 1005   HCT 40.4 10/04/2012 1005   PLT 183 10/04/2012 1005   MCV 88.0 10/04/2012 1005   NEUTROABS 2.7 10/04/2012 1005   LYMPHSABS 1.3 10/04/2012 1005   MONOABS 0.3 10/04/2012 1005   EOSABS 0.1 10/04/2012 1005   BASOSABS 0.0 10/04/2012 1005     Metabolic Panel:    Component Value Date/Time   NA 129* 10/04/2012 1005   K 3.9 10/04/2012 1005   CL 86* 10/04/2012 1005   CO2 17* 10/04/2012 1005    BUN 16 10/04/2012 1005   CREATININE 1.09 10/04/2012 1005   GLUCOSE 648* 10/04/2012 1005   CALCIUM 9.3 10/04/2012 1005   AST 30 10/04/2012 1005   ALT 31 10/04/2012 1005   ALKPHOS 103 10/04/2012 1005   BILITOT 0.8 10/04/2012 1005   PROT 7.9 10/04/2012 1005   ALBUMIN 3.9 10/04/2012 1005     Lactic Acid, Venous  1.6 (10/04/2012)   Urinalysis    Component Value Date/Time   COLORURINE YELLOW 10/04/2012 1129   APPEARANCEUR CLEAR 10/04/2012 1129   LABSPEC 1.039* 10/04/2012 1129   PHURINE 5.0 10/04/2012 1129   GLUCOSEU >1000* 10/04/2012 1129   HGBUR NEGATIVE 10/04/2012 1129   BILIRUBINUR NEGATIVE 10/04/2012 1129   KETONESUR 40* 10/04/2012 1129   PROTEINUR NEGATIVE 10/04/2012 1129   UROBILINOGEN 0.2 10/04/2012 1129   NITRITE NEGATIVE 10/04/2012 1129   LEUKOCYTESUR NEGATIVE 10/04/2012 1129    Drugs of Abuse     Component Value Date/Time  LABOPIA NONE DETECTED 10/04/2012 1129   COCAINSCRNUR NONE DETECTED 10/04/2012 1129   LABBENZ NONE DETECTED 10/04/2012 1129   AMPHETMU NONE DETECTED 10/04/2012 1129   THCU NONE DETECTED 10/04/2012 1129   LABBARB NONE DETECTED 10/04/2012 1129      Imaging results:   Dg Abd Acute W/chest (10/04/2012) - 1. Prominence of stool throughout the colon suggests constipation.   Original Report Authenticated By: Gaylyn Rong, M.D.     Other results:  EKG (10/04/2012) - Normal Sinus Rhythm with rate of approximately 84 bpm, normal axis, ST segments: nonspecific ST changes.    Assessment & Plan:  Pt is a 57 y.o. yo male with a PMHx of schizophrenia and homelessness who was admitted on 10/04/2012 with DKA. Interventions will involve treating his hyperglycemia and establishing continuing care for the patient.   1) Diabetic Ketoacidosis - The patient does not have known history of diabetes mellitus, therefore, this acute presentation represents a new diabetes diagnosis. Admission AG of 26. Patient without known cardiac disease or family history of early CAD, therefore, acute  cardiac pathology is considered, but thought to be less likely. Patient otherwise denies recent drug abuse such as cocaine. Denies excessive alcohol abuse.  - DKA protocol  - Aggressive IVF - has received total of 2 L NS in ER --> will continue NS at 150 cc/hr - Diabetes education, as patient is being newly diagnosed.  - Check HgA1c, TSH,  Troponin q6h x 3 - CM to assess for financial programs to aid in assistance with likely insulin requirements. - Consider check GAD-65 Ab.  - As an outpatient, will need check lipid panel, microalb/cr. - Administer pneumococcal vaccination.  2) Pure anion gapped metabolic acidosis - AG 26 on admission, delta-delta of 1.8 indicating pure AG MA. No fevers, change in mental status to suggest methanol or ethylene glycol toxicity. No recent new medications. No renal failure and mental status changes to account for uremia. UDS and serum alcohol levels within normal limits - Treat as per #1.  3) Dysuria - UA negative for evidence of acute infection. He is sexually active intermittently, - Will add on urine gonorrhea and chlamydia.  4) Constipation - daily BM, but small caliber. Abd XR indication high stool burden. - Will add stool softeners.  5) Homelessness - patient has been homeless for most of his life. Currently homeless. He already has a Clinical research associate, who is attempting to help him get disability.  - CM / SW consults for help with medications / any other community resources given new diagnosis.  6) Tobacco abuse - 1/2 ppd since age 33yo. No known history of COPD.  - Encourage smoking cessation.  7) Cough - patient describes nonproductive cough x 1 week without associated fevers, chills, which may represent an acute bronchitis or viral URI. His admission 1 view CXR is unremarkable and without evidence of PNA. However, given his history of tobacco abuse, and prior incarceration, would benefit from further evaluation.  - Check 2 view CXR - No ABx for now, reconsider  if change in clinical status.     DVT PPX - low molecular weight heparin  CODE STATUS - FULL  CONSULTS PLACED - SW, CM, diabetes educators  DISPO - Disposition is deferred at this time, awaiting improvement of acidosis.   Anticipated discharge in approximately 2-3 day(s).   The patient does not have a current PCP (Provider Default, MD) and does need an [facility] hospital follow-up appointment after discharge.    Is the [facility] hospital follow-up  appointment a one-time only appointment? no.  Does the patient have transportation limitations that hinder transportation to clinic appointments? no - rides the bus.   Signed: Priscella Mann, DO  PGY-3, Internal Medicine Resident 10/04/2012, 12:16 PM

## 2012-10-04 NOTE — ED Notes (Signed)
Pt remains in xray.

## 2012-10-04 NOTE — ED Notes (Signed)
Pt was seen at [facility] yesterday and told he had "low blood sugar" and advised to come to ED.  Pt states that he has been feeling "weak and nauseated" for a week.  Pt is alert and oriented.  Pt is homeless.

## 2012-10-04 NOTE — Progress Notes (Signed)
Utilization review completed.  P.J. Shawneen Deetz,RN,BSN Case Manager [phone]  

## 2012-10-04 NOTE — ED Provider Notes (Signed)
11:51 AM  Date: 10/04/2012  Rate: 84  Rhythm: normal sinus rhythm  QRS Axis: normal  Intervals: normal  ST/T Wave abnormalities: normal  Conduction Disutrbances:none  Narrative Interpretation: Normal EKG  Old EKG Reviewed: none available    Carleene Cooper III, MD 10/04/12 1151

## 2012-10-05 ENCOUNTER — Encounter (HOSPITAL_COMMUNITY): Payer: Self-pay | Admitting: Internal Medicine

## 2012-10-05 DIAGNOSIS — E1010 Type 1 diabetes mellitus with ketoacidosis without coma: Principal | ICD-10-CM

## 2012-10-05 LAB — BASIC METABOLIC PANEL
BUN: 9 mg/dL (ref 6–23)
BUN: 9 mg/dL (ref 6–23)
CO2: 23 mEq/L (ref 19–32)
CO2: 23 mEq/L (ref 19–32)
Calcium: 8.3 mg/dL — ABNORMAL LOW (ref 8.4–10.5)
Calcium: 8.3 mg/dL — ABNORMAL LOW (ref 8.4–10.5)
Calcium: 8.4 mg/dL (ref 8.4–10.5)
Chloride: 102 mEq/L (ref 96–112)
Creatinine, Ser: 0.85 mg/dL (ref 0.50–1.35)
GFR calc non Af Amer: >90 mL/min (ref >90)
Glucose, Bld: 144 mg/dL — ABNORMAL HIGH (ref 70–99)
Glucose, Bld: 165 mg/dL — ABNORMAL HIGH (ref 70–99)
Glucose, Bld: 202 mg/dL — ABNORMAL HIGH (ref 70–99)
Sodium: 135 mEq/L (ref 135–145)

## 2012-10-05 LAB — TROPONIN I
Troponin I: <0.3 ng/mL (ref <0.30)
Troponin I: <0.3 ng/mL (ref <0.30)

## 2012-10-05 LAB — GLUCOSE, CAPILLARY
Glucose-Capillary: 139 mg/dL — ABNORMAL HIGH (ref 70–99)
Glucose-Capillary: 128 mg/dL — ABNORMAL HIGH (ref 70–99)
Glucose-Capillary: 157 mg/dL — ABNORMAL HIGH (ref 70–99)
Glucose-Capillary: 259 mg/dL — ABNORMAL HIGH (ref 70–99)
Glucose-Capillary: 151 mg/dL — ABNORMAL HIGH (ref 70–99)
Glucose-Capillary: 130 mg/dL — ABNORMAL HIGH (ref 70–99)

## 2012-10-05 LAB — HEMOGLOBIN A1C
Hgb A1c MFr Bld: 13 % — ABNORMAL HIGH (ref <5.7)
Mean Plasma Glucose: 326 mg/dL — ABNORMAL HIGH (ref <117)

## 2012-10-05 MED ORDER — LIVING WELL WITH DIABETES BOOK
Freq: Once | Status: AC
  Administered 2012-10-05: 13:00:00
  Filled 2012-10-05: qty 1

## 2012-10-05 MED ORDER — PNEUMOCOCCAL VAC POLYVALENT 25 MCG/0.5ML IJ INJ
0.5000 mL | INJECTION | INTRAMUSCULAR | Status: AC
  Filled 2012-10-05: qty 0.5

## 2012-10-05 MED ORDER — GLUCOSE BLOOD VI STRP: ORAL_STRIP | Status: DC

## 2012-10-05 MED ORDER — INSULIN SYRINGE 31G X 5/16" 1 ML MISC
Status: DC
Start: 2012-10-05 — End: 2012-10-06

## 2012-10-05 MED ORDER — LIVING WELL WITH DIABETES BOOK: 1.0000 | Freq: Once | Status: DC

## 2012-10-05 MED ORDER — INSULIN ASPART PROT & ASPART (70-30 MIX) 100 UNIT/ML ~~LOC~~ SUSP
20.0000 [IU] | Freq: Every day | SUBCUTANEOUS | Status: AC
  Administered 2012-10-05: 20 [IU] via SUBCUTANEOUS

## 2012-10-05 MED ORDER — LANCETS 30G MISC: Status: DC

## 2012-10-05 MED ORDER — POTASSIUM CHLORIDE 10 MEQ/100ML IV SOLN
10.0000 meq | INTRAVENOUS | Status: AC
  Administered 2012-10-05 (×4): 10 meq via INTRAVENOUS

## 2012-10-05 MED ORDER — INSULIN ASPART PROT & ASPART (70-30 MIX) 100 UNIT/ML ~~LOC~~ SUSP: 20.0000 [IU] | Freq: Two times a day (BID) | SUBCUTANEOUS | Status: DC

## 2012-10-05 MED ORDER — INSULIN ASPART 100 UNIT/ML ~~LOC~~ SOLN
0.0000 [IU] | Freq: Three times a day (TID) | SUBCUTANEOUS | Status: DC
  Administered 2012-10-05 (×2): via SUBCUTANEOUS
  Administered 2012-10-05: 3 [IU] via SUBCUTANEOUS
  Administered 2012-10-05: 2 [IU] via SUBCUTANEOUS
  Administered 2012-10-06: 5 [IU] via SUBCUTANEOUS

## 2012-10-05 MED ORDER — INSULIN ASPART PROT & ASPART (70-30 MIX) 100 UNIT/ML ~~LOC~~ SUSP
20.0000 [IU] | Freq: Two times a day (BID) | SUBCUTANEOUS | Status: DC
  Administered 2012-10-06: 20 [IU] via SUBCUTANEOUS
  Filled 2012-10-05: qty 10

## 2012-10-05 MED ORDER — INSULIN GLARGINE 100 UNIT/ML ~~LOC~~ SOLN
20.0000 [IU] | Freq: Every day | SUBCUTANEOUS | Status: DC
  Administered 2012-10-05 (×2): 20 [IU] via SUBCUTANEOUS
  Filled 2012-10-05 (×4): qty 0.2

## 2012-10-05 MED ORDER — INSULIN ASPART PROT & ASPART (70-30 MIX) 100 UNIT/ML ~~LOC~~ SUSP
10.0000 [IU] | Freq: Every day | SUBCUTANEOUS | Status: DC
  Administered 2012-10-05: 10 [IU] via SUBCUTANEOUS
  Filled 2012-10-05: qty 10

## 2012-10-05 NOTE — Plan of Care (Signed)
Problem: Food- and Nutrition-Related Knowledge Deficit (NB-1.1) Goal: Nutrition education Formal process to instruct or train a patient/client in a skill or to impart knowledge to help patients/clients voluntarily manage or modify food choices and eating behavior to maintain or improve health. Outcome: Completed/Met Date Met:  10/05/12  RD consulted for nutrition education regarding diabetes. Pt with new onset of DM. Pt is homeless and reports very limited access to food supplies. Per his report he sleeps in a tent most of the time, but if it's really cold outside he can find somewhere warmer to sleep, such as his "girlfriend's place." Mostly eats 1 meal a day.     Lab Results  Component Value Date    HGBA1C 13.0* 10/04/2012    Discussed sources of excess sugar such as juices, regular sodas and sports drinks that are in patient's diet. Pt verbalized understanding of need for diet sodas/juices and water. Pt not appropriate for further education.  Expect poor compliance.  Body mass index is 24.51 kg/(m^2). Pt meets criteria for normal weight based on current BMI.  Current diet order is Carbohydrate Modified Medium, patient is consuming approximately 100% of meals at this time. Labs and medications reviewed. No further nutrition interventions warranted at this time. RD contact information provided. If additional nutrition issues arise, please re-consult RD.  Jarold Motto MS, RD, LDN Pager: [phone] After-hours pager: [phone]

## 2012-10-05 NOTE — Progress Notes (Addendum)
Inpatient Diabetes Program Recommendations  AACE/ADA: New Consensus Statement on Inpatient Glycemic Control (2013)  Target Ranges:  Prepandial:   less than 140 mg/dL      Peak postprandial:   less than 180 mg/dL (1-2 hours)      Critically ill patients:  140 - 180 mg/dL   Reason for Visit:Spoke with patient and explained why his blood sugars are high, what type 2 dm is, somewhat about limiting portion of carbohydrate. This is a tremendous amount of information for pt to understand and/or retain at this point.  I am concerned that sending him out of the hospital on any insulin may not be the best choice at this time.  He is to be followed by T/S and Jamison Neighbor just visited. He does not yet know how to check a blood sugar nor how to draw up insulin and administer. He knows nothing about hypoglycemia, how to treat, etd.  Today is the first day he has been alert and comfortable enough to comprehend any ed'l information.  Pt is getting ready to watch videos now. I highly recommend that he be started on Metformin 500 bid (with a meal if he can get a meal at his girlfriend's house).  He says he can get one meal a day at her house.  I would also like to call the financial counselor before discharge to get him started on the application for medicaid (which he states was denied last time he tried).  It would be beneficial to keep him hospitalized one more night in order to get him a little bit more oriented to living with diabetes. He could be moved a non-telemetry floor as well.  If he is seen fairly soon after discharge at the IM clinic, can assess further if he truly needs insulin.   The HgbA1C of 13% is high, but it is also affected by the last 2 weeks glucose levels by as much as 2-3 % (that according to our medical directors). His ketones were never high, his GAP closed quickly, and his acidosis resolved fairly quickly as well.  Again, I would highly recommend using Metformin and maybe Amaryl 2-4 mg per day  with a meal (doesn't have to be given at any particular time as long as it is given with a meal.  The CSW has not yet seen the patient as well. Will call financial counselor now in hopes to get ahead in starting applications for assistance.  Thank you, Lenor Coffin, RN, CNS, Diabetes Coordinator [phone]) AD:  Called Financial counselor to get the process started with assistance as pt may qualify for disability with limited vision to to cataracts, bilateral pelvis fxs in the past now requiring him to use a cane to walk without falling. AC

## 2012-10-05 NOTE — Discharge Summary (Signed)
Internal Medicine Teaching [facility] Discharge Note  Name: Victor Savage MRN: [id number] DOB: 1956-01-11 57 y.o.  Date of Admission: 10/04/2012  9:29 AM Date of Discharge: 10/06/2012 Attending Physician: Dr. Criselda Peaches  Discharge Diagnosis: 1. New onset DKA (diabetic ketoacidoses) with Diabetes (HA1C 13.0%)  2. Dysuria  3. Constipation 4. Social issues (Homelessnes, no insurance, financial stressors)  5. Tobacco abuse 6. History of Hypertension 7. History of falls prior to admission 8. Anxiety/Depression/Paranoid Schizophrenia   Discharge Medications:   Medication List    TAKE these medications       divalproex 500 MG 24 hr tablet  Commonly known as:  DEPAKOTE ER  Take 2,000 mg by mouth at bedtime.     FLUoxetine 20 MG capsule  Commonly known as:  PROZAC  Take 20 mg by mouth daily.     insulin aspart protamine-insulin aspart (70-30) 100 UNIT/ML injection  Commonly known as:  NOVOLOG 70/30  Inject 20 Units into the skin 2 (two) times daily with a meal.     living well with diabetes book Misc  1 each by Does not apply route once.     QUEtiapine 300 MG 24 hr tablet  Commonly known as:  SEROQUEL XR  Take 600 mg by mouth at bedtime.        Disposition and follow-up:   Mr.Victor Savage was discharged from [facility] in stable condition.  At the hospital follow up visit please address  1) Medication compliance 2) Follow up GAD 65 3) Repeat BMET 4) Refer to Lynnae January for social work needs  5) start antihypertensive if needed 6) Counsel smoking cessation 7) will need check lipid panel, microalb/cr outpatient  8) Refer to dentist   Follow-up Appointments:  Discharge Orders   Future Appointments Provider Department Dept Phone   10/12/2012 9:00 AM [facility] [phone]   10/12/2012 9:45 AM Judie Bonus, MD [place] [facility] [phone]   Future Orders Complete By  Expires     Diet - low sodium heart healthy  As directed     Discharge instructions  As directed     Comments:      1) Please fill your medications.  [facility] may be cheaper.  Take 20 units of Lantus on the day of discharge with dinner.  Then take 20 units twice a day with meals starting 10/06/12  2) Check your blood sugar three times a day 3) bring in your diabetes meter and medications to all doctors visit 4) follow up with Internal Medicine 10/12/12 with Rudell Cobb (social worker) at 9 am and Dr. Dorise Hiss at 9:45 am.  [facility] floor [facility] [phone].  Bring $20 and the necessary paperwork 5) Please ask to meet with Lynnae January that visit  6) Please read all of information    Increase activity slowly  As directed     POCT Glucose (Device for Home Use)  As directed     Comments:      accucheck glucose meter    PR BLOOD GLUCOSE TEST STRIPS  As directed     Comments:      Accucheck machine for home use.  #100 RF x 11       Consultations: PT OT Diabetes education Lenor Coffin RN Social work  Case manager   Procedures Performed:  Dg Chest 2 View  10/04/2012  *RADIOLOGY REPORT*  Clinical Data: Cough  CHEST - 2 VIEW  Comparison: 10/04/2012  at 1026 hours  Findings: Lungs are clear. No pleural effusion or pneumothorax.  Cardiomediastinal silhouette is within normal limits.  Visualized osseous structures are within normal limits.  IMPRESSION: No evidence of acute cardiopulmonary disease.   Original Report Authenticated By: Charline Bills, M.D.    Dg Abd Acute W/chest  10/04/2012  *RADIOLOGY REPORT*  Clinical Data: Abdominal pain.  Vomiting.  Hypertension.  Diabetes.  ACUTE ABDOMEN SERIES (ABDOMEN 2 VIEW & CHEST 1 VIEW)  Comparison: None.  Findings: Cardiac and mediastinal contours appear normal.  The lungs appear clear.  No pleural effusion is identified.  No free peroneal gas is evident beneath the hemidiaphragms.  Prominence of stool throughout the colon suggests  constipation.  No dilated bowel identified.  No significant abnormal air-fluid levels.  Bilateral hip prosthesis are partially visualized.  IMPRESSION:  1. Prominence of stool throughout the colon suggests constipation.   Original Report Authenticated By: Gaylyn Rong, M.D.    Admission HPI:  Chief Complaint: generalized malaise, polyuria, polydipsia, nausea  History of Present Illness:  Patient is a 57 y.o. male with a PMHx of paranoid schizophrenia, who presents to [facility] for evaluation of generalized malaise, polyuria, polydipsia, craving for sweets, positional lightheadedness x 1 month, that has been worst over last 1 week. Patient also describes associated BL LQ, sharp, intermittent abdominal pain that is nonradiating in character. Confirms dysuria without associated hematuria, flank pain. Denies associated penile discharge, lesions, swelling. Aggravating factors include: caffeine. Alleviating factors include: urination. Last BM last night, typically has BM once daily. Associated symptoms include: nausea and vomiting (nonbloody, nonbilious vomitus x 3 days occuring once daily), dysuria x 2 weeks, dry mouth. The patient denies chills, constipation, diarrhea, hematochezia and hematuria.  Review of Systems:  Constitutional:  admits to decreased appetite, and increased craving for sweets, fatigue.  Denies fever, chills, diaphoresis.   HEENT:  admits to resolving sore throat.  Denies eye pain, redness, hearing loss, ear pain, congestion, rhinorrhea.   Respiratory:  admits to dry cough x 1 week.  Denies SOB, DOE, chest tightness, and wheezing.   Cardiovascular:  admits to palpitations.  Denies chest pain and leg swelling.   Gastrointestinal:  admits to nausea, vomiting, abdominal pain.  Denies diarrhea, constipation, blood in stool.   Genitourinary:  admits to dysuria.  Denies urgency, frequency, hematuria, flank pain and difficulty urinating.   Musculoskeletal:  denies myalgias, back pain, joint  swelling, arthralgias and gait problem.   Skin:  denies pallor, rash and wound.   Neurological:  admits to positional lightheadedness.  Denies dizziness, seizures, syncope, weakness, numbness and headaches.   Hematological:  denies easy bruising, personal or family bleeding history.   Psychiatric:  admits to claustrophobia, easy agitation.  Denies suicidal ideation / homicidal ideation.     Vital Signs:  Blood pressure 159/90, pulse 100, temperature 98 F (36.7 C), temperature source Oral, resp. rate 22, height 6' (1.829 m), weight 180 lb (81.647 kg), SpO2 98.00%.  Physical Exam:  General:  Vital signs reviewed and noted. Well-developed, well-nourished, in no acute distress; alert, appropriate and cooperative throughout examination.   Head:  Normocephalic, atraumatic.   Eyes:  PERRL, EOMI, No signs of anemia or jaundince.   Nose:  Mucous membranes moist, not inflammed, nonerythematous.   Throat:  Oropharynx nonerythematous, no exudate appreciated. Poor dentition with right lower posterior molars with caries. Dry mucous membranes.   Neck:  No deformities, masses, or tenderness noted. Supple, no JVD.   Lungs:  Normal respiratory effort. Clear to auscultation  BL without crackles or wheezes.   Heart:  RRR. S1 and S2 normal without gallop, murmur, or rubs.   Abdomen:  BS normoactive. Soft, Nondistended, non-tender. No masses or organomegaly.   Extremities:  No pretibial edema.   Neurologic:  A&O X3, CN II - XII are grossly intact. Motor strength is 5/5 in the all 4 extremities, Sensations intact to light touch, Cerebellar signs negative.   Skin:  No visible rashes, scars. Dry skin.      Hospital Course by problem list: 1. New onset DKA (diabetic ketoacidoses) with Diabetes (HA1C 13.0%)  2. Dysuria  3. Constipation 4. Social issues (Homelessnes, no insurance, financial stressors)  5. Tobacco abuse 6. History of Hypertension 7. History of falls prior to admission 8.  Anxiety/Depression/Paranoid Schizophrenia    57 y.o. yo male with a PMHx of schizophrenia (paranoid) and homelessness, HTN, tobacco abuse who was admitted on 10/04/2012 with DKA new onset.   1. Diabetic Ketoacidosis (new onset) with new onset diabetes  No known history of diabetes mellitus (HA1C 13.0%). DKA resolved prior to discharge.  Anion gap on admission was 26 which closed by discharge. He was initially started on an insulin drip then transitioned to subcutaneous insulin 70/30 20 units bid.  He should avoid skipping meals and should check his blood glucose two to three times daily.  He will get his medications and diabetic supplies for [employer].  We trended his cardiac enzymes which were negative x 3. He has poor dentition and needs to follow with dentistry outpatient.  He had diabetes education, social work, and case management services this admission.  Pending GAD 65 to differentiate Type 1 vs. Type 2 diabetes.  He will follow with the Internal Medicine Clinic at [facility] 10/12/12 at 9 am.  He will need a lipid panel, microalbumin/creatinine checked outpatient.    2. Dysuria  Noted on admission GC probe negative.    3. Constipation  Given Senna S,  prune juice, and Miralax this admission.   4. Homelessness  Intermittently homeless x 2-3 yrs since being out of jail. Pending disability.  He goes to [employer] for medication assistance and other services.  We consulted case manager, social work, and Artist for help with medications and any other community resources.    5. Tobacco abuse  Smokes 1/2 ppd since age 67yo. No known history of COPD.  W encouraged smoking cessation thought patient not ready to quit. We ordered a pneumococcal vaccine prior to discharge.    6. Questionable history of HTN  While on IVF his blood pressure was 140s-170s/70s-90s. Elevation may have been due to IVF. Blood pressures became more controlled closer to  discharge.  He will need close follow up outpatinet. Consider antihypertensive medication if needed. Per patient a provider prior to admission stated he needed medication for blood pressure.    7. 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  PT evaluated and verbally stated that the patient needs a cane which he was discharged with a cane.    8. Anxiety/Depression/Paranoid Schizophrenia  We continue home medications like Seroquel, Prozac, Depakote.  He follows outpatient with [facility] monthly.  This admission he denies active suicidal ideation, homicidal ideation, auditory or visual hallucinations.     We gave him Lovenox for DVT prophylaxis.     Discharge Vitals:  BP 115/74  Pulse 78  Temp(Src) 97.9 F (36.6 C) (  Oral)  Resp 18  Ht 6' (1.829 m)  Wt 179 lb 12.8 oz (81.557 kg)  BMI 24.38 kg/m2  SpO2 96%  Discharge physical exam:  General:lying in bed, nad, alert and oriented x 3  HEENT:[place]/at, poor dentition  CV: RRR no rubs, murmurs or gallops  Lungs:ctab  ZOX:WRUE, ntnd, nl bs  Extremities:warm, no cyanosis or edema  Neuro: moving all 4 extremities. Alert and oriented x 3   Dicharge Labs:  Results for ALBERTO, PINA (MRN [id number]) as of 10/05/2012 13:05  Ref. Range 10/04/2012 10:48  Sample type No range found VENOUS  pH, Ven Latest Range: 7.250-7.300  7.341 (H)  pCO2, Ven Latest Range: 45.0-50.0 mmHg 36.6 (L)  pO2, Ven Latest Range: 30.0-45.0 mmHg 149.0 (H)  Bicarbonate Latest Range: 20.0-24.0 mEq/L 19.8 (L)  TCO2 Latest Range: 0-100 mmol/L 21  Acid-base deficit Latest Range: 0.0-2.0 mmol/L 5.0 (H)  O2 Saturation No range found 99.0   Results for KENYEN, CANDY (MRN [id number]) as of 10/05/2012 13:05  Ref. Range 10/04/2012 18:34 10/04/2012 20:46 10/05/2012 00:02 10/05/2012 01:54 10/05/2012 04:45  Sodium Latest Range: 135-145 mEq/L 137 137 134 (L) 137 135  Potassium Latest Range: 3.5-5.1 mEq/L  3.4 (L) 3.2 (L) 3.3 (L) 3.3 (L) 4.0  Chloride Latest Range: 96-112 mEq/L 99 99 100 103 102  CO2 Latest Range: 19-32 mEq/L 22 22 22 23 23   BUN Latest Range: 6-23 mg/dL 11 10 9 9 8   Creatinine Latest Range: 0.50-1.35 mg/dL 1.30 8.65 7.84 6.96 2.95  Calcium Latest Range: 8.4-10.5 mg/dL 8.7 8.6 8.3 (L) 8.4 8.3 (L)  GFR calc non Af Amer Latest Range: >90 mL/min >90 >90 >90 >90 >90  GFR calc Af Amer Latest Range: >90 mL/min >90 >90 >90 >90 >90  Glucose Latest Range: 70-99 mg/dL 284 (H) 132 (H) 440 (H) 144 (H) 165 (H)  Troponin I Latest Range: <0.30 ng/mL   <0.30  <0.30   Results for MAHAMADOU, WELTZ (MRN [id number]) as of 10/05/2012 13:05  Ref. Range 10/04/2012 11:20 10/04/2012 12:24 10/04/2012 12:25 10/04/2012 15:25 10/04/2012 17:30 10/04/2012 17:41 10/04/2012 18:34 10/04/2012 20:46 10/05/2012 00:02  Sodium Latest Range: 135-145 mEq/L  133 (L)  139 136  137 137 134 (L)  Potassium Latest Range: 3.5-5.1 mEq/L  3.9  3.4 (L) 3.9  3.4 (L) 3.2 (L) 3.3 (L)  Chloride Latest Range: 96-112 mEq/L  93 (L)  100 98  99 99 100  CO2 Latest Range: 19-32 mEq/L  20  25 25  22 22 22   BUN Latest Range: 6-23 mg/dL  14  12 11  11 10 9   Creatinine Latest Range: 0.50-1.35 mg/dL  4.40  3.47 4.25  9.56 0.87 0.88  Calcium Latest Range: 8.4-10.5 mg/dL  9.1  8.7 8.7  8.7 8.6 8.3 (L)  GFR calc non Af Amer Latest Range: >90 mL/min  >90  >90 >90  >90 >90 >90  GFR calc Af Amer Latest Range: >90 mL/min  >90  >90 >90  >90 >90 >90  Glucose Latest Range: 70-99 mg/dL  387 (H)  564 (H) 332 (H)  158 (H) 203 (H) 202 (H)  Phosphorus Latest Range: 2.3-4.6 mg/dL  2.3         Troponin I Latest Range: <0.30 ng/mL   <0.30   <0.30   <0.30   Results for GWEN, EDLER (MRN [id number]) as of 10/05/2012 13:05  Ref. Range 10/04/2012 10:05 10/04/2012 11:18 10/04/2012 11:20 10/04/2012 12:24  Sodium Latest Range: 135-145 mEq/L 129 (L)   133 (L)  Potassium Latest Range:  3.5-5.1 mEq/L 3.9   3.9  Chloride Latest Range: 96-112 mEq/L 86 (L)   93 (L)  CO2 Latest  Range: 19-32 mEq/L 17 (L)   20  Mean Plasma Glucose Latest Range: <117 mg/dL  782 (H)    BUN Latest Range: 6-23 mg/dL 16   14  Creatinine Latest Range: 0.50-1.35 mg/dL 9.56   2.13  Calcium Latest Range: 8.4-10.5 mg/dL 9.3   9.1  GFR calc non Af Amer Latest Range: >90 mL/min 75 (L)   >90  GFR calc Af Amer Latest Range: >90 mL/min 87 (L)   >90  Glucose Latest Range: 70-99 mg/dL 086 (HH)   578 (H)  Phosphorus Latest Range: 2.3-4.6 mg/dL    2.3  Alkaline Phosphatase Latest Range: 39-117 U/L 103     Albumin Latest Range: 3.5-5.2 g/dL 3.9     Lipase Latest Range: 11-59 U/L 24     AST Latest Range: 0-37 U/L 30     ALT Latest Range: 0-53 U/L 31     Total Protein Latest Range: 6.0-8.3 g/dL 7.9     Ammonia Latest Range: 11-60 umol/L 28     Total Bilirubin Latest Range: 0.3-1.2 mg/dL 0.8     Lactic Acid, Venous Latest Range: 0.5-2.2 mmol/L   1.6   Osmolality Latest Range: 275-300 mOsm/kg  310 (H)     Results for HASKEL, DEWALT (MRN [id number]) as of 10/05/2012 13:05  Ref. Range 10/04/2012 10:05 10/04/2012 15:25  WBC Latest Range: 4.0-10.5 K/uL 4.4 5.1  RBC Latest Range: 4.22-5.81 MIL/uL 4.59 4.34  Hemoglobin Latest Range: 13.0-17.0 g/dL 41.3 24.4  HCT Latest Range: 39.0-52.0 % 40.4 36.9 (L)  MCV Latest Range: 78.0-100.0 fL 88.0 85.0  MCH Latest Range: 26.0-34.0 pg 32.2 32.3  MCHC Latest Range: 30.0-36.0 g/dL 01.0 (H) 27.2 (H)  RDW Latest Range: 11.5-15.5 % 12.5 12.6  Platelets Latest Range: 150-400 K/uL 183 187  Neutrophils Relative Latest Range: 43-77 % 60   Lymphocytes Relative Latest Range: 12-46 % 30   Monocytes Relative Latest Range: 3-12 % 8   Eosinophils Relative Latest Range: 0-5 % 1   Basophils Relative Latest Range: 0-1 % 1   NEUT# Latest Range: 1.7-7.7 K/uL 2.7   Lymphocytes Absolute Latest Range: 0.7-4.0 K/uL 1.3   Monocytes Absolute Latest Range: 0.1-1.0 K/uL 0.3   Eosinophils Absolute Latest Range: 0.0-0.7 K/uL 0.1   Basophils Absolute Latest Range: 0.0-0.1 K/uL 0.0     Results for SAMUAL, BEALS (MRN [id number]) as of 10/05/2012 13:05  Ref. Range 10/04/2012 11:18  TSH Latest Range: 0.350-4.500 uIU/mL 1.872   Results for RYUN, VELEZ (MRN [id number]) as of 10/05/2012 13:05  Ref. Range 10/04/2012 11:29  GC Probe RNA Latest Range: NEGATIVE  NEGATIVE  CT Probe RNA Latest Range: NEGATIVE  NEGATIVE   Results for KHAMRON, GELLERT (MRN [id number]) as of 10/05/2012 13:05  Ref. Range 10/04/2012 11:29  Color, Urine Latest Range: YELLOW  YELLOW  APPearance Latest Range: CLEAR  CLEAR  Specific Gravity, Urine Latest Range: 1.005-1.030  1.039 (H)  pH Latest Range: 5.0-8.0  5.0  Glucose Latest Range: NEGATIVE mg/dL >1884 (A)  Bilirubin Urine Latest Range: NEGATIVE  NEGATIVE  Ketones, ur Latest Range: NEGATIVE mg/dL 40 (A)  Protein Latest Range: NEGATIVE mg/dL NEGATIVE  Urobilinogen, UA Latest Range: 0.0-1.0 mg/dL 0.2  Nitrite Latest Range: NEGATIVE  NEGATIVE  Leukocytes, UA Latest Range: NEGATIVE  NEGATIVE  Hgb urine dipstick Latest Range: NEGATIVE  NEGATIVE  WBC, UA Latest Range: <3 WBC/hpf 0-2  RBC / HPF Latest Range: <  3 RBC/hpf 0-2  Squamous Epithelial / LPF Latest Range: RARE  RARE  Bacteria, UA Latest Range: RARE  RARE   Results for JOVANIE, VERGE (MRN [id number]) as of 10/05/2012 13:05  Ref. Range 11/26/2011 20:45 10/04/2012 11:18 10/04/2012 11:29  Acetone, Bld Latest Range: NEGATIVE   MODERATE (A)   Alcohol, Ethyl (B) Latest Range: 0-11 mg/dL 409 (H) <81   Amphetamines Latest Range: NONE DETECTED    NONE DETECTED  Barbiturates Latest Range: NONE DETECTED    NONE DETECTED  Benzodiazepines Latest Range: NONE DETECTED    NONE DETECTED  Opiates Latest Range: NONE DETECTED    NONE DETECTED  COCAINE Latest Range: NONE DETECTED    NONE DETECTED  Tetrahydrocannabinol Latest Range: NONE DETECTED    NONE DETECTED  Results for RYKAR, LEBLEU (MRN [id number]) as of 10/05/2012 13:05  Ref. Range 10/04/2012 15:20  MRSA by PCR Latest Range: NEGATIVE  NEGATIVE   Hemoglobin  A1C 13.0   Results for orders placed during the hospital encounter of 10/04/12 (from the past 24 hour(s))  GLUCOSE, CAPILLARY     Status: Abnormal   Collection Time    10/05/12  8:08 PM      Result Value Range   Glucose-Capillary 130 (*) 70 - 99 mg/dL   Comment 1 Notify RN    TROPONIN I     Status: None   Collection Time    10/05/12 11:43 PM      Result Value Range   Troponin I <0.30  <0.30 ng/mL  BASIC METABOLIC PANEL     Status: Abnormal   Collection Time    10/06/12  5:30 AM      Result Value Range   Sodium 139  135 - 145 mEq/L   Potassium 4.0  3.5 - 5.1 mEq/L   Chloride 104  96 - 112 mEq/L   CO2 27  19 - 32 mEq/L   Glucose, Bld 93  70 - 99 mg/dL   BUN 7  6 - 23 mg/dL   Creatinine, Ser 6.21  0.50 - 1.35 mg/dL   Calcium 8.7  8.4 - 30.8 mg/dL   GFR calc non Af Amer 78 (*) >90 mL/min   GFR calc Af Amer >90  >90 mL/min  TROPONIN I     Status: None   Collection Time    10/06/12  5:30 AM      Result Value Range   Troponin I <0.30  <0.30 ng/mL  GLUCOSE, CAPILLARY     Status: Abnormal   Collection Time    10/06/12  7:32 AM      Result Value Range   Glucose-Capillary 107 (*) 70 - 99 mg/dL  GLUCOSE, CAPILLARY     Status: Abnormal   Collection Time    10/06/12 11:56 AM      Result Value Range   Glucose-Capillary 229 (*) 70 - 99 mg/dL    Signed: Annett Gula 10/06/2012, 6:00 PM   Time Spent on Discharge: >31 minutes  Services Ordered on Discharge: none Equipment Ordered on Discharge: none

## 2012-10-05 NOTE — Progress Notes (Signed)
Inpatient Diabetes Program Recommendations  AACE/ADA: New Consensus Statement on Inpatient Glycemic Control (2013)  Target Ranges:  Prepandial:   less than 140 mg/dL      Peak postprandial:   less than 180 mg/dL (1-2 hours)      Critically ill patients:  140 - 180 mg/dL  Have reviewed pt history.  Pt may not need insulin at discharge.  He may do well with Metformin to start. Noted order for 70/30 but will follow to assess needs for glucose control.  Have ordered bedside RN education using the system network of videos Exit care notes, Dietician consult, and ADA ed booklet. Will visit pt to assess other ed'l needs.  Thank you, Lenor Coffin, RN, CNS, Diabetes Coordinator [phone])

## 2012-10-05 NOTE — Progress Notes (Addendum)
Subjective: Patient denies nausea/vomiting/abdominal pain.  His frequency of urination has decreased.  He denies dizziness or lightheadedness.  He is tolerating diet.  He has not had a bowel movement.  He is requesting a cane due to fall prior to admission (weeks) and states he baseline walks with a limp. He states [facility] was trying to get him a prescription for HTN.    On day of admission patient states he had abscess in right bottom tooth 4 days ago used Ambisol now resolved.  Denies GU blood. Reports subjective fever and chills, denies sweats.  He states prior to admission his throat was sore x 3 days but now resolved.  He reports scraping his right ankle ~1 week ago area is healing and he is up to date on his tetanus.  He states he has been coughing x 2-3 weeks.  Denies weight loss or change in appetite.  Prior to admission patient states he was drinking a lot of koolaid.  Patient denies back pain.  Patient states prior to admission he was nauseated, vomiting, had increased urinary frequency x 2 weeks and dysuria.  He denies diarrhea but reports constipation.  He also reports decreased energy x 4-5 days.  He denies AH/VH/SI or HI.  Report unknown FH of DM.    Objective: Vital signs in last 24 hours: Filed Vitals:   10/05/12 0200 10/05/12 0300 10/05/12 0400 10/05/12 0600  BP: 108/60 101/59 122/69 107/64  Pulse: 84 83 73 85  Temp:  98.7 F (37.1 C)    TempSrc:  Oral    Resp: 11 12 12 19   Height:      Weight: 180 lb 12.4 oz (82 kg)     SpO2: 97% 97% 97% 98%   Weight change:   Intake/Output Summary (Last 24 hours) at 10/05/12 1610 Last data filed at 10/05/12 0600  Gross per 24 hour  Intake 2770.23 ml  Output    375 ml  Net 2395.23 ml   General:lying in bed, nad, alert and oriented x 3 HEENT:/at, poor dentition CV: RRR no rubs, murmurs or gallops Lungs:ctab RUE:AVWU, ntnd, nl bs Extremities:warm, no cyanosis or edema  Neuro: moving all 4 extremities. Alert  and oriented x 3   Lab Results: Basic Metabolic Panel:  Recent Labs Lab 10/04/12 1005 10/04/12 1224  10/05/12 0002 10/05/12 0154  NA 129* 133*  < > 134* 137  K 3.9 3.9  < > 3.3* 3.3*  CL 86* 93*  < > 100 103  CO2 17* 20  < > 22 23  GLUCOSE 648* 406*  < > 202* 144*  BUN 16 14  < > 9 9  CREATININE 1.09 0.91  < > 0.88 0.85  CALCIUM 9.3 9.1  < > 8.3* 8.4  PHOS  --  2.3  --   --   --   < > = values in this interval not displayed. Liver Function Tests:  Recent Labs Lab 10/04/12 1005  AST 30  ALT 31  ALKPHOS 103  BILITOT 0.8  PROT 7.9  ALBUMIN 3.9    Recent Labs Lab 10/04/12 1005  LIPASE 24    Recent Labs Lab 10/04/12 1005  AMMONIA 28   CBC:  Recent Labs Lab 10/04/12 1005 10/04/12 1525  WBC 4.4 5.1  NEUTROABS 2.7  --   HGB 14.8 14.0  HCT 40.4 36.9*  MCV 88.0 85.0  PLT 183 187   Cardiac Enzymes:  Recent Labs Lab 10/04/12 1225 10/04/12 1741 10/05/12 0002  TROPONINI <0.30 <0.30 <0.30   CBG:  Recent Labs Lab 10/04/12 1820 10/04/12 2004 10/04/12 2113 10/04/12 2227 10/04/12 2334 10/05/12 0038  GLUCAP 146* 168* 176* 220* 205* 186*   Hemoglobin A1C: No results found for this basename: HGBA1C,  in the last 168 hours Thyroid Function Tests:  Recent Labs Lab 10/04/12 1118  TSH 1.872   Urine Drug Screen: Drugs of Abuse     Component Value Date/Time   LABOPIA NONE DETECTED 10/04/2012 1129   COCAINSCRNUR NONE DETECTED 10/04/2012 1129   LABBENZ NONE DETECTED 10/04/2012 1129   AMPHETMU NONE DETECTED 10/04/2012 1129   THCU NONE DETECTED 10/04/2012 1129   LABBARB NONE DETECTED 10/04/2012 1129    Alcohol Level:  Recent Labs Lab 10/04/12 1118  ETH <11   Urinalysis:  Recent Labs Lab 10/04/12 1129  COLORURINE YELLOW  LABSPEC 1.039*  PHURINE 5.0  GLUCOSEU >1000*  HGBUR NEGATIVE  BILIRUBINUR NEGATIVE  KETONESUR 40*  PROTEINUR NEGATIVE  UROBILINOGEN 0.2  NITRITE NEGATIVE  LEUKOCYTESUR NEGATIVE   Misc. Labs: [employer]  Results: Recent Results (from the past 240 hour(s))  MRSA PCR SCREENING     Status: None   Collection Time    10/04/12  3:20 PM      Result Value Range Status   MRSA by PCR NEGATIVE  NEGATIVE Final   Comment:            The GeneXpert MRSA Assay (FDA     approved for NASAL specimens     only), is one component of a     comprehensive MRSA colonization     surveillance program. It is not     intended to diagnose MRSA     infection nor to guide or     monitor treatment for     MRSA infections.   Studies/Results: Dg Chest 2 View  10/04/2012  *RADIOLOGY REPORT*  Clinical Data: Cough  CHEST - 2 VIEW  Comparison: 10/04/2012 at 1026 hours  Findings: Lungs are clear. No pleural effusion or pneumothorax.  Cardiomediastinal silhouette is within normal limits.  Visualized osseous structures are within normal limits.  IMPRESSION: No evidence of acute cardiopulmonary disease.   Original Report Authenticated By: Charline Bills, M.D.    Dg Abd Acute W/chest  10/04/2012  *RADIOLOGY REPORT*  Clinical Data: Abdominal pain.  Vomiting.  Hypertension.  Diabetes.  ACUTE ABDOMEN SERIES (ABDOMEN 2 VIEW & CHEST 1 VIEW)  Comparison: None.  Findings: Cardiac and mediastinal contours appear normal.  The lungs appear clear.  No pleural effusion is identified.  No free peroneal gas is evident beneath the hemidiaphragms.  Prominence of stool throughout the colon suggests constipation.  No dilated bowel identified.  No significant abnormal air-fluid levels.  Bilateral hip prosthesis are partially visualized.  IMPRESSION:  1. Prominence of stool throughout the colon suggests constipation.   Original Report Authenticated By: Gaylyn Rong, M.D.    Medications:  Scheduled Meds: . divalproex  2,000 mg Oral QHS  . enoxaparin (LOVENOX) injection  40 mg Subcutaneous Q24H  . FLUoxetine  20 mg Oral Daily  . insulin aspart  0-15 Units Subcutaneous TID WC & HS  . insulin glargine  20 Units Subcutaneous QHS  . QUEtiapine   600 mg Oral QHS  . senna-docusate  1 tablet Oral BID   Continuous Infusions: . dextrose 5 % and 0.9% NaCl 125 mL/hr at 10/05/12 0552  . insulin (NOVOLIN-R) infusion Stopped (10/05/12 0552)   PRN Meds:.dextrose, polyvinyl alcohol Assessment/Plan: 57 y.o. yo male with  a PMHx of schizophrenia (paranoid) and homelessness, HTN, tobacco abuse who was admitted on 10/04/2012 with DKA new onset  1.  Diabetic Ketoacidosis (new onset) with new onset diabetes -No known history of diabetes mellitus. DKA resolved -Admission AG of 26 now 10 (closed) -Cardiac enzymes negative x 3.  Patient has poor dentition and needs to follow with dentistry outpatient.   -IVF, insulin gtt protocol started initially now patient off drip and transitioned to Lanutus 20 units sq, SSI  -Diabetes education, social work, and case management consulted -DM educator contacted team and does not want patient discharged due to patient needing more education on how to administer insulin.  Also patient eats one meal daily and she is concerned about bid dosing on 70/30.  -HgA1c-13.0%.  Pending GAD 65  -Rx 70/30 10 units once today, still on SSI -will Rx Lantus 70/30 (20 units bid) and supplies to check glucose tid  -will follow with [facility] 10/12/12    -will need check lipid panel, microalb/cr outpatient   2. Dysuria  -GC probe negative   3. Constipation  -no BM with Senna S.   -given prune juice today  4. Homelessness  -intermittently homeless x 2-3 yrs .  Pending disability.  - CM / SW consults pending for help with medications / any other community resources  5. Tobacco abuse  -1/2 ppd since age 31yo. No known history of COPD.  - Encourage smoking cessation thought patient not ready to quit  -will Rx pneumococcal vx prior to discharge   6. History of HTN -140s-170s/70s-90s.  Elevation may have been due to ivf.  Last BP 107/64 -will need close BP follow up outpatinet.  Consider antihypertensive med.  Per patient a provider  prior to admission stated he needed medication for blood pressure  7. History of falls prior to admission per patient  -per patient walks with a limp and needs a walker  -will get PT/OT to eval and treat for discharge a clinical need for a walker    8. Anxiety/Depression/Paranoid Schizophrenia -Continue home meds Seroquel, Prozac, Depakote -follows outpatient with [facility] monthly -No active SI/HI/AH/VH  9. DVT px  -Lovenox    Dispo: home tomorrow once he has more DM education.  Will transfer to tele today pending PT/OT, SW, CM input   The patient does not have a current PCP, therefore will be requiring OPC or other PCP follow-up after discharge. Will follow with [facility] 10/12/12 at 9 am with Rudell Cobb and 9:45 with Dr. Dorise Hiss.    The patient does have transportation limitations that hinder transportation to clinic appointments.  Services Needed at time of discharge: Y = Yes, Blank = No PT:   OT:   RN:   Equipment:   Other:     LOS: 1 day   Annett Gula [phone] 10/05/2012, 6:05 AM

## 2012-10-05 NOTE — H&P (Signed)
Internal Medicine Teaching Service Attending Note Date: 10/05/2012  Patient name: Victor Savage  Medical record number: [id number]  Date of birth: [birth year]   I have seen and evaluated Victor Savage and discussed their care with the Residency Team.    Victor Savage is a 57yo man with PMH of paranoid schizophrenia who presented to the hospital complaining of malaise, polyuria, positional lightheadedness, nausea and vomiting that have been worsening for the past week.  Some of these symptoms were also present for the past month.  He also has abdominal pain which he describes as LQ bilaterally, sharp and intermittent.  He also has dysuria without hematuria or flank pain and dry mouth.  In the ED, he was found to have a significantly elevated blood glucose.  He does not have a history of DM, but does have family members with this diagnosis.    He is currently homeless, but able to take his medications and is plugged in to community resources.   For further PMH, PSH, meds, allergies and ROS, please see resident note.     Physical Exam: Blood pressure 124/61, pulse 96, temperature 98.6 F (37 C), temperature source Oral, resp. rate 16, height 6' (1.829 m), weight 180 lb 12.4 oz (82 kg), SpO2 99.00%. General appearance: alert, cooperative and appears stated age, MMM Head: Normocephalic, without obvious abnormality, atraumatic Eyes: EOMI, anicteric sclerae Lungs: clear to auscultation bilaterally and no wheezing Heart: RR, NR, no murmur Abdomen: Soft, NT, +BS normoactive Extremities: no edema, warm and well perfused Pulses: 2+ and symmetric  Lab results: Results for orders placed during the hospital encounter of 10/04/12 (from the past 24 hour(s))  CBC WITH DIFFERENTIAL     Status: Abnormal   Collection Time    10/04/12 10:05 AM      Result Value Range   WBC 4.4  4.0 - 10.5 K/uL   RBC 4.59  4.22 - 5.81 MIL/uL   Hemoglobin 14.8  13.0 - 17.0 g/dL   HCT 40.9  81.1 - 91.4 %   MCV 88.0  78.0  - 100.0 fL   MCH 32.2  26.0 - 34.0 pg   MCHC 36.6 (*) 30.0 - 36.0 g/dL   RDW 78.2  95.6 - 21.3 %   Platelets 183  150 - 400 K/uL   Neutrophils Relative 60  43 - 77 %   Neutro Abs 2.7  1.7 - 7.7 K/uL   Lymphocytes Relative 30  12 - 46 %   Lymphs Abs 1.3  0.7 - 4.0 K/uL   Monocytes Relative 8  3 - 12 %   Monocytes Absolute 0.3  0.1 - 1.0 K/uL   Eosinophils Relative 1  0 - 5 %   Eosinophils Absolute 0.1  0.0 - 0.7 K/uL   Basophils Relative 1  0 - 1 %   Basophils Absolute 0.0  0.0 - 0.1 K/uL  COMPREHENSIVE METABOLIC PANEL     Status: Abnormal   Collection Time    10/04/12 10:05 AM      Result Value Range   Sodium 129 (*) 135 - 145 mEq/L   Potassium 3.9  3.5 - 5.1 mEq/L   Chloride 86 (*) 96 - 112 mEq/L   CO2 17 (*) 19 - 32 mEq/L   Glucose, Bld 648 (*) 70 - 99 mg/dL   BUN 16  6 - 23 mg/dL   Creatinine, Ser 0.86  0.50 - 1.35 mg/dL   Calcium 9.3  8.4 - 57.8 mg/dL   Total Protein 7.9  6.0 - 8.3 g/dL   Albumin 3.9  3.5 - 5.2 g/dL   AST 30  0 - 37 U/L   ALT 31  0 - 53 U/L   Alkaline Phosphatase 103  39 - 117 U/L   Total Bilirubin 0.8  0.3 - 1.2 mg/dL   GFR calc non Af Amer 75 (*) >90 mL/min   GFR calc Af Amer 87 (*) >90 mL/min  LIPASE, BLOOD     Status: None   Collection Time    10/04/12 10:05 AM      Result Value Range   Lipase 24  11 - 59 U/L  AMMONIA     Status: None   Collection Time    10/04/12 10:05 AM      Result Value Range   Ammonia 28  11 - 60 umol/L  GLUCOSE, CAPILLARY     Status: Abnormal   Collection Time    10/04/12 10:43 AM      Result Value Range   Glucose-Capillary 548 (*) 70 - 99 mg/dL  POCT I-STAT 3, BLOOD GAS (G3P V)     Status: Abnormal   Collection Time    10/04/12 10:48 AM      Result Value Range   pH, Ven 7.341 (*) 7.250 - 7.300   pCO2, Ven 36.6 (*) 45.0 - 50.0 mmHg   pO2, Ven 149.0 (*) 30.0 - 45.0 mmHg   Bicarbonate 19.8 (*) 20.0 - 24.0 mEq/L   TCO2 21  0 - 100 mmol/L   O2 Saturation 99.0     Acid-base deficit 5.0 (*) 0.0 - 2.0 mmol/L    Sample type VENOUS    KETONES, QUALITATIVE     Status: Abnormal   Collection Time    10/04/12 11:18 AM      Result Value Range   Acetone, Bld MODERATE (*) NEGATIVE  TSH     Status: None   Collection Time    10/04/12 11:18 AM      Result Value Range   TSH 1.872  0.350 - 4.500 uIU/mL  ETHANOL     Status: None   Collection Time    10/04/12 11:18 AM      Result Value Range   Alcohol, Ethyl (B) <11  0 - 11 mg/dL  OSMOLALITY     Status: Abnormal   Collection Time    10/04/12 11:18 AM      Result Value Range   Osmolality 310 (*) 275 - 300 mOsm/kg  LACTIC ACID, PLASMA     Status: None   Collection Time    10/04/12 11:20 AM      Result Value Range   Lactic Acid, Venous 1.6  0.5 - 2.2 mmol/L  URINALYSIS, ROUTINE W REFLEX MICROSCOPIC     Status: Abnormal   Collection Time    10/04/12 11:29 AM      Result Value Range   Color, Urine YELLOW  YELLOW   APPearance CLEAR  CLEAR   Specific Gravity, Urine 1.039 (*) 1.005 - 1.030   pH 5.0  5.0 - 8.0   Glucose, UA >1000 (*) NEGATIVE mg/dL   Hgb urine dipstick NEGATIVE  NEGATIVE   Bilirubin Urine NEGATIVE  NEGATIVE   Ketones, ur 40 (*) NEGATIVE mg/dL   Protein, ur NEGATIVE  NEGATIVE mg/dL   Urobilinogen, UA 0.2  0.0 - 1.0 mg/dL   Nitrite NEGATIVE  NEGATIVE   Leukocytes, UA NEGATIVE  NEGATIVE  URINE RAPID DRUG SCREEN (HOSP PERFORMED)     Status: None  Collection Time    10/04/12 11:29 AM      Result Value Range   Opiates NONE DETECTED  NONE DETECTED   Cocaine NONE DETECTED  NONE DETECTED   Benzodiazepines NONE DETECTED  NONE DETECTED   Amphetamines NONE DETECTED  NONE DETECTED   Tetrahydrocannabinol NONE DETECTED  NONE DETECTED   Barbiturates NONE DETECTED  NONE DETECTED  URINE MICROSCOPIC-ADD ON     Status: None   Collection Time    10/04/12 11:29 AM      Result Value Range   Squamous Epithelial / LPF RARE  RARE   WBC, UA 0-2  <3 WBC/hpf   RBC / HPF 0-2  <3 RBC/hpf   Bacteria, UA RARE  RARE  GLUCOSE, CAPILLARY     Status:  Abnormal   Collection Time    10/04/12 11:54 AM      Result Value Range   Glucose-Capillary 443 (*) 70 - 99 mg/dL  BASIC METABOLIC PANEL     Status: Abnormal   Collection Time    10/04/12 12:24 PM      Result Value Range   Sodium 133 (*) 135 - 145 mEq/L   Potassium 3.9  3.5 - 5.1 mEq/L   Chloride 93 (*) 96 - 112 mEq/L   CO2 20  19 - 32 mEq/L   Glucose, Bld 406 (*) 70 - 99 mg/dL   BUN 14  6 - 23 mg/dL   Creatinine, Ser 1.61  0.50 - 1.35 mg/dL   Calcium 9.1  8.4 - 09.6 mg/dL   GFR calc non Af Amer >90  >90 mL/min   GFR calc Af Amer >90  >90 mL/min  PHOSPHORUS     Status: None   Collection Time    10/04/12 12:24 PM      Result Value Range   Phosphorus 2.3  2.3 - 4.6 mg/dL  TROPONIN I     Status: None   Collection Time    10/04/12 12:25 PM      Result Value Range   Troponin I <0.30  <0.30 ng/mL  GLUCOSE, CAPILLARY     Status: Abnormal   Collection Time    10/04/12  1:02 PM      Result Value Range   Glucose-Capillary 371 (*) 70 - 99 mg/dL  GLUCOSE, CAPILLARY     Status: Abnormal   Collection Time    10/04/12  1:57 PM      Result Value Range   Glucose-Capillary 315 (*) 70 - 99 mg/dL  GLUCOSE, CAPILLARY     Status: Abnormal   Collection Time    10/04/12  2:53 PM      Result Value Range   Glucose-Capillary 187 (*) 70 - 99 mg/dL  MRSA PCR SCREENING     Status: None   Collection Time    10/04/12  3:20 PM      Result Value Range   MRSA by PCR NEGATIVE  NEGATIVE  BASIC METABOLIC PANEL     Status: Abnormal   Collection Time    10/04/12  3:25 PM      Result Value Range   Sodium 139  135 - 145 mEq/L   Potassium 3.4 (*) 3.5 - 5.1 mEq/L   Chloride 100  96 - 112 mEq/L   CO2 25  19 - 32 mEq/L   Glucose, Bld 159 (*) 70 - 99 mg/dL   BUN 12  6 - 23 mg/dL   Creatinine, Ser 0.45  0.50 - 1.35 mg/dL   Calcium  8.7  8.4 - 10.5 mg/dL   GFR calc non Af Amer >90  >90 mL/min   GFR calc Af Amer >90  >90 mL/min  CBC     Status: Abnormal   Collection Time    10/04/12  3:25 PM       Result Value Range   WBC 5.1  4.0 - 10.5 K/uL   RBC 4.34  4.22 - 5.81 MIL/uL   Hemoglobin 14.0  13.0 - 17.0 g/dL   HCT 16.1 (*) 09.6 - 04.5 %   MCV 85.0  78.0 - 100.0 fL   MCH 32.3  26.0 - 34.0 pg   MCHC 37.9 (*) 30.0 - 36.0 g/dL   RDW 40.9  81.1 - 91.4 %   Platelets 187  150 - 400 K/uL  GLUCOSE, CAPILLARY     Status: Abnormal   Collection Time    10/04/12  4:01 PM      Result Value Range   Glucose-Capillary 146 (*) 70 - 99 mg/dL   Comment 1 Notify RN    GLUCOSE, CAPILLARY     Status: Abnormal   Collection Time    10/04/12  5:22 PM      Result Value Range   Glucose-Capillary 116 (*) 70 - 99 mg/dL  BASIC METABOLIC PANEL     Status: Abnormal   Collection Time    10/04/12  5:30 PM      Result Value Range   Sodium 136  135 - 145 mEq/L   Potassium 3.9  3.5 - 5.1 mEq/L   Chloride 98  96 - 112 mEq/L   CO2 25  19 - 32 mEq/L   Glucose, Bld 122 (*) 70 - 99 mg/dL   BUN 11  6 - 23 mg/dL   Creatinine, Ser 7.82  0.50 - 1.35 mg/dL   Calcium 8.7  8.4 - 95.6 mg/dL   GFR calc non Af Amer >90  >90 mL/min   GFR calc Af Amer >90  >90 mL/min  TROPONIN I     Status: None   Collection Time    10/04/12  5:41 PM      Result Value Range   Troponin I <0.30  <0.30 ng/mL  GLUCOSE, CAPILLARY     Status: Abnormal   Collection Time    10/04/12  6:20 PM      Result Value Range   Glucose-Capillary 146 (*) 70 - 99 mg/dL   Comment 1 Notify RN    BASIC METABOLIC PANEL     Status: Abnormal   Collection Time    10/04/12  6:34 PM      Result Value Range   Sodium 137  135 - 145 mEq/L   Potassium 3.4 (*) 3.5 - 5.1 mEq/L   Chloride 99  96 - 112 mEq/L   CO2 22  19 - 32 mEq/L   Glucose, Bld 158 (*) 70 - 99 mg/dL   BUN 11  6 - 23 mg/dL   Creatinine, Ser 2.13  0.50 - 1.35 mg/dL   Calcium 8.7  8.4 - 08.6 mg/dL   GFR calc non Af Amer >90  >90 mL/min   GFR calc Af Amer >90  >90 mL/min  GLUCOSE, CAPILLARY     Status: Abnormal   Collection Time    10/04/12  8:04 PM      Result Value Range    Glucose-Capillary 168 (*) 70 - 99 mg/dL  BASIC METABOLIC PANEL     Status: Abnormal   Collection Time  10/04/12  8:46 PM      Result Value Range   Sodium 137  135 - 145 mEq/L   Potassium 3.2 (*) 3.5 - 5.1 mEq/L   Chloride 99  96 - 112 mEq/L   CO2 22  19 - 32 mEq/L   Glucose, Bld 203 (*) 70 - 99 mg/dL   BUN 10  6 - 23 mg/dL   Creatinine, Ser 1.61  0.50 - 1.35 mg/dL   Calcium 8.6  8.4 - 09.6 mg/dL   GFR calc non Af Amer >90  >90 mL/min   GFR calc Af Amer >90  >90 mL/min  GLUCOSE, CAPILLARY     Status: Abnormal   Collection Time    10/04/12  9:13 PM      Result Value Range   Glucose-Capillary 176 (*) 70 - 99 mg/dL   Comment 1 Notify RN    GLUCOSE, CAPILLARY     Status: Abnormal   Collection Time    10/04/12 10:27 PM      Result Value Range   Glucose-Capillary 220 (*) 70 - 99 mg/dL  GLUCOSE, CAPILLARY     Status: Abnormal   Collection Time    10/04/12 11:34 PM      Result Value Range   Glucose-Capillary 205 (*) 70 - 99 mg/dL  TROPONIN I     Status: None   Collection Time    10/05/12 12:02 AM      Result Value Range   Troponin I <0.30  <0.30 ng/mL  BASIC METABOLIC PANEL     Status: Abnormal   Collection Time    10/05/12 12:02 AM      Result Value Range   Sodium 134 (*) 135 - 145 mEq/L   Potassium 3.3 (*) 3.5 - 5.1 mEq/L   Chloride 100  96 - 112 mEq/L   CO2 22  19 - 32 mEq/L   Glucose, Bld 202 (*) 70 - 99 mg/dL   BUN 9  6 - 23 mg/dL   Creatinine, Ser 0.45  0.50 - 1.35 mg/dL   Calcium 8.3 (*) 8.4 - 10.5 mg/dL   GFR calc non Af Amer >90  >90 mL/min   GFR calc Af Amer >90  >90 mL/min  GLUCOSE, CAPILLARY     Status: Abnormal   Collection Time    10/05/12 12:38 AM      Result Value Range   Glucose-Capillary 186 (*) 70 - 99 mg/dL  BASIC METABOLIC PANEL     Status: Abnormal   Collection Time    10/05/12  1:54 AM      Result Value Range   Sodium 137  135 - 145 mEq/L   Potassium 3.3 (*) 3.5 - 5.1 mEq/L   Chloride 103  96 - 112 mEq/L   CO2 23  19 - 32 mEq/L   Glucose,  Bld 144 (*) 70 - 99 mg/dL   BUN 9  6 - 23 mg/dL   Creatinine, Ser 4.09  0.50 - 1.35 mg/dL   Calcium 8.4  8.4 - 81.1 mg/dL   GFR calc non Af Amer >90  >90 mL/min   GFR calc Af Amer >90  >90 mL/min  BASIC METABOLIC PANEL     Status: Abnormal   Collection Time    10/05/12  4:45 AM      Result Value Range   Sodium 135  135 - 145 mEq/L   Potassium 4.0  3.5 - 5.1 mEq/L   Chloride 102  96 - 112 mEq/L  CO2 23  19 - 32 mEq/L   Glucose, Bld 165 (*) 70 - 99 mg/dL   BUN 8  6 - 23 mg/dL   Creatinine, Ser 1.61  0.50 - 1.35 mg/dL   Calcium 8.3 (*) 8.4 - 10.5 mg/dL   GFR calc non Af Amer >90  >90 mL/min   GFR calc Af Amer >90  >90 mL/min  TROPONIN I     Status: None   Collection Time    10/05/12  4:45 AM      Result Value Range   Troponin I <0.30  <0.30 ng/mL    Imaging results:  Dg Chest 2 View  10/04/2012  *RADIOLOGY REPORT*  Clinical Data: Cough  CHEST - 2 VIEW  Comparison: 10/04/2012 at 1026 hours  Findings: Lungs are clear. No pleural effusion or pneumothorax.  Cardiomediastinal silhouette is within normal limits.  Visualized osseous structures are within normal limits.  IMPRESSION: No evidence of acute cardiopulmonary disease.   Original Report Authenticated By: Charline Bills, M.D.    Dg Abd Acute W/chest  10/04/2012  *RADIOLOGY REPORT*  Clinical Data: Abdominal pain.  Vomiting.  Hypertension.  Diabetes.  ACUTE ABDOMEN SERIES (ABDOMEN 2 VIEW & CHEST 1 VIEW)  Comparison: None.  Findings: Cardiac and mediastinal contours appear normal.  The lungs appear clear.  No pleural effusion is identified.  No free peroneal gas is evident beneath the hemidiaphragms.  Prominence of stool throughout the colon suggests constipation.  No dilated bowel identified.  No significant abnormal air-fluid levels.  Bilateral hip prosthesis are partially visualized.  IMPRESSION:  1. Prominence of stool throughout the colon suggests constipation.   Original Report Authenticated By: Gaylyn Rong, M.D.      Assessment and Plan: I agree with the formulated Assessment and Plan with the following changes:   1. DKA, new onset DM - Agree with plan as outlined in the resident note - Monitor for AG closure and treat with IV insulin, fluids, potassium.  Once gap closes, can initiate SQ insulin.  The main issue long term will be initiating outpatient therapy for his diabetes and ensuring that he has access to the appropriate medications.  - We have asked our Case Manager to assess his situation with access to care and medications - I discussed with the team regarding checking Gad-65 Ab to give a little more information as to whether he can successfully be transitioned to PO medications  2. Dysuria - GC/chlamydia pending  Encourage smoking cessation  Other issues as per resident note.   Inez Catalina, MD 3/19/20149:38 AM

## 2012-10-05 NOTE — Clinical Social Work Psychosocial (Signed)
     Clinical Social Work Department BRIEF PSYCHOSOCIAL ASSESSMENT 10/05/2012  Patient:  [name]     Account Number:  [id number]     Admit date:  10/04/2012  Clinical Social Worker:  Margaree Mackintosh  Date/Time:  10/05/2012 12:00 M  Referred by:  Physician  Date Referred:  10/05/2012 Referred for  Homelessness   Other Referral:   Interview type:  Patient Other interview type:    PSYCHOSOCIAL DATA Living Status:  ALONE Admitted from facility:   Level of care:   Primary support name:  Victor Savage: [phone] Primary support relationship to patient:  FRIEND Degree of support available:   Unknown.    CURRENT CONCERNS Current Concerns  Post-Acute Placement   Other Concerns:    SOCIAL WORK ASSESSMENT / PLAN Clinical Social Worker recieved referral indicating pt is newly diagnosed diabetes and is chronically homeless.  CSW reviewed chart and met with pt at bedside.  CSW introduced self, explained role, and provided support.  CSW provided active listening.  Pt shared that he has been homeless "since the '90's".  Pt shares his time between staying with his girlfriend and staying on the streets.  Pt is currently linked with the [facility] and [facility].  CSW staffed case with Director; pt currenlty not a candidate for [employer]. Pt can continue to be seen at the [facility] for continued support and evaluation of services.  CSW provided resource information for shelters and hot meals to pt.  CSW to continue to follow and assist as needed.  Pt currently recieves food stamps.   Assessment/plan status:  Information/Referral to [facility] Other assessment/ plan:   Information/referral to community resources:   [employer]  [employer]  Food Pantry  Shelters  Emergency Financial [employer]ces.    PATIENTS/FAMILYS RESPONSE TO PLAN OF CARE: Pt was pleasant and engaged in cosnversation.  Pt thanked CSW fo ritnervention.

## 2012-10-05 NOTE — Progress Notes (Signed)
Visit to patient while in hospital. Will call after he is discharged to assist with transition of care. 

## 2012-10-06 LAB — BASIC METABOLIC PANEL
CO2: 27 mEq/L (ref 19–32)
Calcium: 8.7 mg/dL (ref 8.4–10.5)
Creatinine, Ser: 1.05 mg/dL (ref 0.50–1.35)
GFR calc Af Amer: >90 mL/min (ref >90)
GFR calc non Af Amer: 78 mL/min — ABNORMAL LOW (ref >90)
Sodium: 139 mEq/L (ref 135–145)

## 2012-10-06 LAB — GLUCOSE, CAPILLARY: Glucose-Capillary: 229 mg/dL — ABNORMAL HIGH (ref 70–99)

## 2012-10-06 LAB — TROPONIN I: Troponin I: <0.3 ng/mL (ref <0.30)

## 2012-10-06 MED ORDER — INSULIN ASPART PROT & ASPART (70-30 MIX) 100 UNIT/ML ~~LOC~~ SUSP: 20.0000 [IU] | Freq: Two times a day (BID) | SUBCUTANEOUS | Status: DC

## 2012-10-06 MED ORDER — POLYETHYLENE GLYCOL 3350 17 G PO PACK
17.0000 g | PACK | Freq: Once | ORAL | Status: DC
  Filled 2012-10-06: qty 1

## 2012-10-06 NOTE — Progress Notes (Signed)
Referral received today for CSW to get bus passes for the pt to get to [place] downtown and to get home today from [place]. Met with pt to discuss [facility] and other community resources in [place] regarding getting some glasses. CSW called [facility] to find out if they have any reading glasses. CSW was told that [facility] normally does have glasses however right now they are out of them.  CSW informed pt and he will check back with them next week.    Sherald Barge, LCSW-A Clinical Social Worker [phone]

## 2012-10-06 NOTE — Evaluation (Signed)
Physical Therapy Evaluation Patient Details Name: Victor Savage MRN: [id number] DOB: 1956-05-14 Today's Date: 10/06/2012 Time: 6295-2841 PT Time Calculation (min): 30 min  PT Assessment / Plan / Recommendation Clinical Impression  pt adm with malaise nausea polyuria.  Pt with new dx of diabetes.  On eval, pt presenting with som chronic pain, but generally back or approaching baseline.  Pt would benefit from a standard cane.  No further PT needs, no follow up.  Sign off.    PT Assessment  Patent does not need any further PT services    Follow Up Recommendations  No PT follow up    Does the patient have the potential to tolerate intense rehabilitation      Barriers to Discharge        Equipment Recommendations  None recommended by PT    Recommendations for Other Services     Frequency      Precautions / Restrictions Precautions Precautions: None Restrictions Weight Bearing Restrictions: No   Pertinent Vitals/Pain       Mobility  Bed Mobility Bed Mobility: Supine to Sit;Sitting - Scoot to Edge of Bed Supine to Sit: 7: Independent Sitting - Scoot to Edge of Bed: 7: Independent Details for Bed Mobility Assistance: safe mobility Transfers Transfers: Sit to Stand;Stand to Sit Sit to Stand: 7: Independent;With upper extremity assist;From bed Stand to Sit: 7: Independent;With upper extremity assist;To bed Ambulation/Gait Ambulation/Gait Assistance: 7: Independent Ambulation Distance (Feet): 300 Feet Assistive device: Straight cane Ambulation/Gait Assistance Details: Instructed in proper use of cane.  Steady and safe with cane. Gait Pattern: Within Functional Limits Stairs: Yes Stairs Assistance: 6: Modified independent (Device/Increase time) Stair Management Technique: Step to pattern;One rail Right;Forwards Number of Stairs: 10    Exercises     PT Diagnosis:    PT Problem List:   PT Treatment Interventions:     PT Goals    Visit Information  Last PT  Received On: 10/06/12 Assistance Needed: +1    Subjective Data  Subjective: I really never know where I'll be Patient Stated Goal: I really would like to get a cane and get to talk with the financial Academic librarian)   Prior Functioning  Home Living Lives With: Alone;Other (Comment) (mostly homeless, different places) Home Adaptive Equipment: None Prior Function Level of Independence: Independent Able to Take Stairs?: Yes Communication Communication: No difficulties    Cognition  Cognition Overall Cognitive Status: Appears within functional limits for tasks assessed/performed Arousal/Alertness: Awake/alert Orientation Level: Appears intact for tasks assessed Behavior During Session: [facility] for tasks performed    Extremity/Trunk Assessment Right Upper Extremity Assessment RUE ROM/Strength/Tone: [facility] for tasks assessed;Deficits RUE ROM/Strength/Tone Deficits: mildly weak due to shd musculoskeletal pain bil Left Upper Extremity Assessment LUE ROM/Strength/Tone: WFL for tasks assessed;Deficits LUE ROM/Strength/Tone Deficits: see R UE Right Lower Extremity Assessment RLE ROM/Strength/Tone: Within functional levels Left Lower Extremity Assessment LLE ROM/Strength/Tone: Within functional levels Trunk Assessment Trunk Assessment: Normal   Balance Balance Balance Assessed: No  End of Session PT - End of Session Activity Tolerance: Patient tolerated treatment well Patient left: Other (comment);with call bell/phone within reach (sitting EOB) Nurse Communication: Mobility status  GP     Happy Begeman, Eliseo Gum 10/06/2012, 10:50 AM 10/06/2012  Ontario Bing, PT [phone] (pager)

## 2012-10-06 NOTE — Plan of Care (Signed)
Problem: Discharge Progression Outcomes Goal: Obtain signed CBG meter Rx form Outcome: Adequate for Discharge See case manager"s note

## 2012-10-06 NOTE — Progress Notes (Signed)
Subjective: Patient denies problems this am.  He still did not have a bowel movement since drinking prune juice yesterday.  He is tolerating diet and feels much better since day of admission.    Objective: Vital signs in last 24 hours: Filed Vitals:   10/05/12 1205 10/05/12 1656 10/05/12 2100 10/06/12 0500  BP: 135/56 123/82 134/80 115/74  Pulse: 102 98 87 78  Temp: 97.7 F (36.5 C) 97.8 F (36.6 C) 99.1 F (37.3 C) 97.9 F (36.6 C)  TempSrc: Oral  Oral Oral  Resp: 14 18 17 18   Height:      Weight:    179 lb 12.8 oz (81.557 kg)  SpO2: 99% 100% 97% 96%   Weight change: -3.2 oz (-0.091 kg) No intake or output data in the 24 hours ending 10/06/12 1302 General:lying in bed, nad, alert and oriented x 3 HEENT:[place]/at CV: RRR no rubs, murmurs, gallops Lungs:ctab ION:GEXB, obese, ntnd, nl bs Extremities:warm, no cyanosis or edema  Neuro: moving all 4 extremities, alert and oriented x 3   Lab Results: Basic Metabolic Panel:  Recent Labs Lab 10/04/12 1005 10/04/12 1224  10/05/12 0445 10/06/12 0530  NA 129* 133*  < > 135 139  K 3.9 3.9  < > 4.0 4.0  CL 86* 93*  < > 102 104  CO2 17* 20  < > 23 27  GLUCOSE 648* 406*  < > 165* 93  BUN 16 14  < > 8 7  CREATININE 1.09 0.91  < > 0.86 1.05  CALCIUM 9.3 9.1  < > 8.3* 8.7  PHOS  --  2.3  --   --   --   < > = values in this interval not displayed. Liver Function Tests:  Recent Labs Lab 10/04/12 1005  AST 30  ALT 31  ALKPHOS 103  BILITOT 0.8  PROT 7.9  ALBUMIN 3.9    Recent Labs Lab 10/04/12 1005  LIPASE 24    Recent Labs Lab 10/04/12 1005  AMMONIA 28   CBC:  Recent Labs Lab 10/04/12 1005 10/04/12 1525  WBC 4.4 5.1  NEUTROABS 2.7  --   HGB 14.8 14.0  HCT 40.4 36.9*  MCV 88.0 85.0  PLT 183 187   Cardiac Enzymes:  Recent Labs Lab 10/05/12 1757 10/05/12 2343 10/06/12 0530  TROPONINI <0.30 <0.30 <0.30   CBG:  Recent Labs Lab 10/05/12 0831 10/05/12 1200 10/05/12 1650 10/05/12 2008  10/06/12 0732 10/06/12 1156  GLUCAP 259* 319* 151* 130* 107* 229*   Hemoglobin A1C:  Recent Labs Lab 10/04/12 1118  HGBA1C 13.0*   Thyroid Function Tests:  Recent Labs Lab 10/04/12 1118  TSH 1.872   Urine Drug Screen: Drugs of Abuse     Component Value Date/Time   LABOPIA NONE DETECTED 10/04/2012 1129   COCAINSCRNUR NONE DETECTED 10/04/2012 1129   LABBENZ NONE DETECTED 10/04/2012 1129   AMPHETMU NONE DETECTED 10/04/2012 1129   THCU NONE DETECTED 10/04/2012 1129   LABBARB NONE DETECTED 10/04/2012 1129    Alcohol Level:  Recent Labs Lab 10/04/12 1118  ETH <11   Urinalysis:  Recent Labs Lab 10/04/12 1129  COLORURINE YELLOW  LABSPEC 1.039*  PHURINE 5.0  GLUCOSEU >1000*  HGBUR NEGATIVE  BILIRUBINUR NEGATIVE  KETONESUR 40*  PROTEINUR NEGATIVE  UROBILINOGEN 0.2  NITRITE NEGATIVE  LEUKOCYTESUR NEGATIVE   Misc. Labs: none Micro Results: Recent Results (from the past 240 hour(s))  GC/CHLAMYDIA PROBE AMP     Status: None   Collection Time    10/04/12  11:29 AM      Result Value Range Status   CT Probe RNA NEGATIVE  NEGATIVE Final   GC Probe RNA NEGATIVE  NEGATIVE Final   Comment: (NOTE)                                                                                              Normal Reference Range: Negative          Assay performed using the Gen-Probe APTIMA COMBO2 (R) Assay.     Acceptable specimen types for this assay include APTIMA Swabs (Unisex,     endocervical, urethral, or vaginal), first void urine, and ThinPrep     liquid based cytology samples.  MRSA PCR SCREENING     Status: None   Collection Time    10/04/12  3:20 PM      Result Value Range Status   MRSA by PCR NEGATIVE  NEGATIVE Final   Comment:            The GeneXpert MRSA Assay (FDA     approved for NASAL specimens     only), is one component of a     comprehensive MRSA colonization     surveillance program. It is not     intended to diagnose MRSA     infection nor to guide or      monitor treatment for     MRSA infections.   Studies/Results: Dg Chest 2 View  10/04/2012  *RADIOLOGY REPORT*  Clinical Data: Cough  CHEST - 2 VIEW  Comparison: 10/04/2012 at 1026 hours  Findings: Lungs are clear. No pleural effusion or pneumothorax.  Cardiomediastinal silhouette is within normal limits.  Visualized osseous structures are within normal limits.  IMPRESSION: No evidence of acute cardiopulmonary disease.   Original Report Authenticated By: Charline Bills, M.D.    Medications:  Scheduled Meds: . divalproex  2,000 mg Oral QHS  . enoxaparin (LOVENOX) injection  40 mg Subcutaneous Q24H  . FLUoxetine  20 mg Oral Daily  . insulin aspart  0-15 Units Subcutaneous TID WC & HS  . insulin aspart protamine-insulin aspart  20 Units Subcutaneous BID WC  . insulin glargine  20 Units Subcutaneous QHS  . polyethylene glycol  17 g Oral Once  . QUEtiapine  600 mg Oral QHS  . senna-docusate  1 tablet Oral BID   Continuous Infusions:   PRN Meds:.dextrose, polyvinyl alcohol Assessment/Plan: 57 y.o. yo male with a PMHx of schizophrenia (paranoid) and homelessness, HTN, tobacco abuse who was admitted on 10/04/2012 with DKA new onset  1.  Diabetic Ketoacidosis (new onset) with new onset diabetes -No known history of diabetes mellitus. DKA resolved.  Pending GAD 65 to differentiate Type 1 vs. Type 2 diabetes.  -Patient has poor dentition and needs to follow with dentistry outpatient.   -Lanutus (70/30) 20 units sq bid, SSI  -discharged home with 70/30 20 units bid.  Advised patient to not take the medication if he misses meals to avoid hypoglycemia.  Did not order diabetic supplies because the [employer] in [place] will provide patient with a meter and supplies as well as all  of his home medications.   -Diabetes education, social work, and case management consulted -HgA1c-13.0% -will follow with [facility] 10/12/12    -will need check lipid panel, microalb/cr outpatient   2.  Tobacco abuse  -1/2 ppd since age 71yo. No known history of COPD.  -Encourage smoking cessation thought patient not ready to quit  -will Rx pneumococcal vx prior to discharge   3. ? History of HTN -BP more controlled 115/74 -will need close BP follow up outpatinet.  Consider antihypertensive med.  In future if needed   4. History of falls prior to admission per patient  -per patient walks with a limp and needs a cane   -PT verbally recommended a cane which was ordered upon discharge    5. Anxiety/Depression/Paranoid Schizophrenia -Continue home meds Seroquel, Prozac, Depakote -follows outpatient with [facility] monthly -No active SI/HI/AH/VH  6. DVT px  -Lovenox   Dispo: home today   The patient does not have a current PCP, therefore will be requiring OPC follow-up after discharge. Will follow with [facility] 10/12/12 at 9 am with Rudell Cobb and 9:45 with Dr. Dorise Hiss.    The patient does have transportation limitations that hinder transportation to clinic appointments.  Services Needed at time of discharge: Y = Yes, Blank = No PT:   OT:   RN:   Equipment: cane  Other:     LOS: 2 days   Annett Gula [phone] 10/06/2012, 1:02 PM

## 2012-10-06 NOTE — Discharge Summary (Signed)
I saw Victor Savage on day of discharge and formulated the plan with the resident team.

## 2012-10-06 NOTE — Care Management Note (Addendum)
    Page 1 of 2   10/06/2012     11:15:43 AM   CARE MANAGEMENT NOTE 10/06/2012  Patient:  [facility]   Account Number:  [id number]  Date Initiated:  10/06/2012  Documentation initiated by:  MAYO,HENRIETTA  Subjective/Objective Assessment:   57 yr-old male adm with dx of DKA, has h/o paranoid schizophrenia and is homeless     Action/Plan:   Anticipated DC Date:     Anticipated DC Plan:    In-house referral  Financial Counselor  Clinical Social Worker      DC Associate Professor  CM consult      Choice offered to / List presented to:     DME arranged  [facility]      DME agency  [facility]        Status of service:  Completed, signed off Medicare Important Message given?   (If response is "NO", the following Medicare IM given date fields will be blank) Date Medicare IM given:   Date Additional Medicare IM given:    Discharge Disposition:  HOME/SELF CARE  Per UR Regulation:  Reviewed for med. necessity/level of care/duration of stay  If discussed at Long Length of Stay Meetings, dates discussed:    Comments:  10-06-12 1112 Gerome Apley [phone] CM called [facility] and spoke with Nurse Karen Kitchens. She has 70/30 insulin and will give to pt and she has a meter/ strips. Pt will f/u in clinic and will be helped to get assistance with meds. CSW to assist pt with bus pass. Gilmer Mor will delivered to room by [facility]. No further needs at this time.   10/05/12 1122 Henrietta Mayo RN BSN MSN CCM Pt is homeless but states he periodically stays with his girlfriend who provides one meal a day.  Uses food stamps @ other times to purchase food.  Had SSI and MCD but has been incarcerated for 3 years and lost the benefits, now has an attorney helping him with disability application, has also reapplied for MCD, was initially denied but will reapply after disability determination.  Pt should qualify for SSI/MCD if denied disability - is aware.  CSW also following.

## 2012-10-07 ENCOUNTER — Telehealth: Payer: Self-pay | Admitting: Dietician

## 2012-10-07 NOTE — Telephone Encounter (Signed)
Discharge date:10-06-12 Call date: 10-07-12 Hospital follow up appointment date: 10-12-12  Patient called asking about his cane  & the name of there person who gave it to him in the hospital. CDE asked pt about transition of care from hospital to home. Discharge medications reviewed:only insulin- he confirmed taking it as prescribed Able to fill all prescriptions?yes Patient aware of hospital follow up appointments. yes No problems with transportation.no   Other problems/concerns:blood sugars 400s, doesn't know how to get it down, drinking water and OJ, eating moderate sized meals twice daily. Has cotton mouth. cde advised no OJ now, only water and to keep drinking even it it feels like he is peeing too much. Continue checking blood sugar 3x daily and taking insulin as prescribed. Will discuss with attending physician.

## 2012-10-07 NOTE — Telephone Encounter (Signed)
Returned call to patient after discussing with Dr. Dalphine Handing: he was not available so left message with his friend who repeated back instructions to increase his insulin to 23 units twice daily, go to ER if blood sugars continue to rise or he starts feeling bad over the weekend and to call us on Monday with blood sugars

## 2012-10-10 NOTE — Telephone Encounter (Signed)
Called patient back yo give him order; he says meter read "hi", cotton mouth is only symptom, asked about new medicine or over the counter medicines.  Says he is taking : Fluoxetine 20 mg, seroqul  300 mg-  2 at night, depakote 500 mg 4 at night and was taking these while in the hospital.   He has an appointment 9 and 945 Wednesday am with doctor and financial counselor, but can come in Tuesday if needed.

## 2012-10-10 NOTE — Telephone Encounter (Signed)
Ok to go up to 25 units twice daily. Additionally, this patient needs to schedule an appointment as soon as possible, laterst within 7 -10 days, for further evaluation, diet management and insulin management. Needs to bring his glucometer.  But before the appointment, good idea to call the clinic with updates every 2-3 days for blood sugar management.

## 2012-10-10 NOTE — Telephone Encounter (Signed)
Patient reports he has been taking 23 units insulin twice daily & blood sugars still in the 400s over the weekend. This am it was 317; this is the lowest he says it has been. He is eating two meals a day trying to eat smaller portions, only drinking water. Will route to attending physician for input

## 2012-10-10 NOTE — Telephone Encounter (Signed)
I second Dr. Dorris Singh recs.

## 2012-10-10 NOTE — Telephone Encounter (Signed)
Agree. Discussed with Lupita Leash on 3/21.

## 2012-10-11 ENCOUNTER — Ambulatory Visit: Payer: Self-pay

## 2012-10-11 ENCOUNTER — Encounter: Payer: Self-pay | Admitting: Radiation Oncology

## 2012-10-11 ENCOUNTER — Ambulatory Visit (INDEPENDENT_AMBULATORY_CARE_PROVIDER_SITE_OTHER): Payer: Self-pay | Admitting: Radiation Oncology

## 2012-10-11 VITALS — BP 135/79 | HR 88 | Temp 97.1°F | Ht 72.0 in | Wt 223.2 lb

## 2012-10-11 DIAGNOSIS — E1310 Other specified diabetes mellitus with ketoacidosis without coma: Secondary | ICD-10-CM

## 2012-10-11 DIAGNOSIS — Z720 Tobacco use: Secondary | ICD-10-CM

## 2012-10-11 DIAGNOSIS — F200 Paranoid schizophrenia: Secondary | ICD-10-CM

## 2012-10-11 DIAGNOSIS — F17200 Nicotine dependence, unspecified, uncomplicated: Secondary | ICD-10-CM

## 2012-10-11 DIAGNOSIS — E1110 Type 2 diabetes mellitus with ketoacidosis without coma: Secondary | ICD-10-CM

## 2012-10-11 LAB — BASIC METABOLIC PANEL
BUN: 11 mg/dL (ref 6–23)
CO2: 28 mEq/L (ref 19–32)
Chloride: 96 mEq/L (ref 96–112)
Creat: 1.09 mg/dL (ref 0.50–1.35)
Potassium: 4 mEq/L (ref 3.5–5.3)

## 2012-10-11 LAB — LIPID PANEL
HDL: 48 mg/dL (ref >39)
LDL Cholesterol: 70 mg/dL (ref 0–99)
Total CHOL/HDL Ratio: 3.3 Ratio
Triglycerides: 188 mg/dL — ABNORMAL HIGH (ref <150)
VLDL: 38 mg/dL (ref 0–40)

## 2012-10-11 MED ORDER — INSULIN ASPART PROT & ASPART (70-30 MIX) 100 UNIT/ML ~~LOC~~ SUSP: 28.0000 [IU] | Freq: Two times a day (BID) | SUBCUTANEOUS | Status: DC

## 2012-10-11 NOTE — Telephone Encounter (Signed)
Per Mr. Durio- Blood sugar 375  This am. He agrees to 1:45 Pm appointment today.

## 2012-10-11 NOTE — Progress Notes (Signed)
Subjective:    Patient ID: Victor Savage, male    DOB: [birth year], 56 y.o.   MRN: [id number]  Diabetes Associated symptoms include fatigue, polydipsia and polyuria. Pertinent negatives for diabetes include no chest pain.   Patient is a 57 year old man with past medical history significant for schizophrenia, homelessness, recently diagnosed type 2 diabetes mellitus, who presents to clinic today for followup on a recent hospitalization for DKA. This is the patient's first episode of DKA, and also the first time he has been diagnosed with diabetes mellitus. He recovered well, and was discharged on 70/30 insulin 20 units twice a day with meals. He states he has been feeling well overall since been discharged approximately one week ago, however he admits to dry mouth,  Polyuria/polydipsia, and increased fatigue. He states he's been checking his CBGs 3-4 times per day since being discharged, and the values been consistently in the mid 300s despite attempting to eat a low carbohydrate diet. He states he has been taking his insulin as directed, and has been taking 25 units twice a day since yesterday, when he was instructed to increase his dosage secondary to persistent hyperglycemia.   Review of Systems  Constitutional: Positive for fatigue.  HENT: Negative.   Eyes: Negative.   Cardiovascular: Negative for chest pain.  Gastrointestinal: Negative for nausea, abdominal pain and diarrhea.  Endocrine: Positive for polydipsia and polyuria.  Genitourinary: Negative for dysuria and hematuria.  Musculoskeletal: Negative.   Skin: Negative.   Allergic/Immunologic: Negative.   Neurological: Negative.   Hematological: Negative.   Psychiatric/Behavioral: Positive for sleep disturbance and dysphoric mood. Negative for suicidal ideas.   Current Outpatient Medications: Current Outpatient Prescriptions  Medication Sig Dispense Refill  . divalproex (DEPAKOTE ER) 500 MG 24 hr tablet Take 2,000 mg by mouth at  bedtime.       Marland Kitchen FLUoxetine (PROZAC) 20 MG capsule Take 20 mg by mouth daily.      . insulin aspart protamine-insulin aspart (NOVOLOG 70/30) (70-30) 100 UNIT/ML injection Inject 20 Units into the skin 2 (two) times daily with a meal.  10 mL  5  . living well with diabetes book MISC 1 each by Does not apply route once.      Marland Kitchen QUEtiapine (SEROQUEL XR) 300 MG 24 hr tablet Take 600 mg by mouth at bedtime.       No current facility-administered medications for this visit.    Allergies: No Known Allergies   Past Medical History: Past Medical History  Diagnosis Date  . Schizophrenia, paranoid type     follows at [facility]  . Glaucoma   . Cataract     bilateral  . Decreased visual acuity     can only see well with right eye  . Tobacco abuse   . Hypertension   . Dysrhythmia   . Anxiety   . Depression   . Asthma   . Diabetes     dka 09/2012   . Headache     Past Surgical History: Past Surgical History  Procedure Laterality Date  . Total hip arthroplasty Bilateral 2004, 2006    2/2 injury from fall  . Knee cartilage surgery Right   . R rear finger      Family History: Family History  Problem Relation Age of Onset  . Coronary artery disease Sister     Social History: History   Social History  . Marital Status: Divorced    Spouse Name: N/A    Number of Children: 1  . Years  of Education: GED   Occupational History  . Unemployed    Social History Main Topics  . Smoking status: Current Every Day Smoker -- 0.50 packs/day for 40 years    Types: Cigarettes  . Smokeless tobacco: Former Neurosurgeon  . Alcohol Use: No  . Drug Use: No     Comment: previously used to snort cocaine in 1990s, remote THC useage  . Sexually Active: Not on file   Other Topics Concern  . Not on file   Social History Narrative   Homeless almost all of his life. Was incarcerated in 2012-2013 x8 mo for assault - released April 2013.    Can read and write minimally.   Has food stamps.   Pending  disability    Previous job in [employer], cleaning floors            Vital Signs: Blood pressure 135/79, pulse 88, temperature 97.1 F (36.2 C), temperature source Oral, height 6' (1.829 m), weight 223 lb 3.2 oz (101.243 kg), SpO2 97.00%.      Objective:   Physical Exam  Constitutional: He is oriented to person, place, and time. He appears well-developed and well-nourished. No distress.  HENT:  Head: Normocephalic and atraumatic.  Eyes: Conjunctivae are normal. Pupils are equal, round, and reactive to light. No scleral icterus.  Neck: Normal range of motion. Neck supple. No tracheal deviation present.  Cardiovascular: Normal rate and regular rhythm.   No murmur heard. Pulmonary/Chest: Breath sounds normal. He has no wheezes. He has no rales.  Abdominal: Soft. Bowel sounds are normal. He exhibits no distension. There is no tenderness.  Musculoskeletal: Normal range of motion. He exhibits no edema.  Ambulating with assistance of cane.  Neurological: He is alert and oriented to person, place, and time. No cranial nerve deficit.  Skin: Skin is warm and dry. No erythema.  Psychiatric: He has a normal mood and affect. His behavior is normal.          Assessment & Plan:

## 2012-10-11 NOTE — Patient Instructions (Signed)
Please take 28U of 70/30 insulin twice daily with meals. Continue monitoring your blood sugars with your meter, and contact the clinic if your sugars become too low (<60) and/or you have symptoms of hypoglycemia.

## 2012-10-11 NOTE — Assessment & Plan Note (Addendum)
  Assessment:  Progress toward smoking cessation:  smoking less  Barriers to progress toward smoking cessation:     Comments:   Plan:  Instruction/counseling given:  I counseled patient on the dangers of tobacco use.  Educational resources provided:     Self management tools provided:     Medications to assist with smoking cessation:   Patient agreed to the following self-care plans for smoking cessation:      Other: smoking less (1 cigarette per day per his report) as he cannot afford cigarettes, but agrees to make a concerted effort to quit entirely within the near future.

## 2012-10-11 NOTE — Assessment & Plan Note (Signed)
Stable. Denies SI/HI or visual/auditory hallucinations. Continues to follow at [facility]

## 2012-10-11 NOTE — Assessment & Plan Note (Addendum)
Lab Results  Component Value Date   HGBA1C 13.0* 10/04/2012     Assessment:  Diabetes control:    Progress toward A1C goal:     Comments: A1c = 13.0 on 10/04/2012 at which time the patient was diagnosed with diabetes mellitus. GAD antibody negative, making type 1 diabetes mellitus unlikely. Also, as CBGs are not significantly affected by insulin, it is unlikely that the patient has diabetes mellitus secondary to pancreatic insufficiency, thus type 2 diabetes mellitus is most likely diagnosis. Unclear why patient would present with DKA from type 2 diabetes mellitus, and although the patient's anion gap of 26 on admission strongly favors DKA, given the newly available negative GAD antibody results, would recommend an ABG be performed on any subsequent admission for DKA like symptoms to confirm patient is truly having episodes of DKA.  His glucometer printout reveals that his CBGs have been consistently in the mid 300s to >600 since his discharge, despite his 70/30 dose being titrated upwards from 20 units twice a day to 25 units twice a day during that time. He has no evidence of hypoglycemia per his CBG meter.  Plan:  Medications:  Increase 70/30 from 25U bid to 28U bid.   Home glucose monitoring:   Frequency: 4 times a day   Timing:    Instruction/counseling given: reminded to bring blood glucose meter & log to each visit and reminded to bring medications to each visit  Educational resources provided:    Self management tools provided:    Other plans: Urine microalbumin, lipid panel, and BMP all checked today per discharge recommendations. As the patient has a PCP at [facility], he will present care for his next followup visit, which he will schedule within the next month. We will forward all of his records from his visit today to this office.

## 2012-10-12 ENCOUNTER — Encounter (HOSPITAL_COMMUNITY): Payer: Self-pay

## 2012-10-12 ENCOUNTER — Ambulatory Visit: Payer: Self-pay

## 2012-10-12 ENCOUNTER — Ambulatory Visit: Payer: Self-pay | Admitting: Internal Medicine

## 2012-10-12 ENCOUNTER — Emergency Department (INDEPENDENT_AMBULATORY_CARE_PROVIDER_SITE_OTHER)
Admission: EM | Admit: 2012-10-12 | Discharge: 2012-10-12 | Disposition: A | Discharge status: Home / Self Care | Payer: Self-pay | Source: Home / Self Care

## 2012-10-12 DIAGNOSIS — R739 Hyperglycemia, unspecified: Secondary | ICD-10-CM

## 2012-10-12 DIAGNOSIS — R7309 Other abnormal glucose: Secondary | ICD-10-CM

## 2012-10-12 LAB — MICROALBUMIN / CREATININE URINE RATIO
Creatinine, Urine: 56 mg/dL
Microalb, Ur: 0.5 mg/dL (ref 0.00–1.89)

## 2012-10-12 LAB — POCT URINALYSIS DIP (DEVICE)
Ketones, ur: NEGATIVE mg/dL
Leukocytes, UA: NEGATIVE
Protein, ur: NEGATIVE mg/dL
Specific Gravity, Urine: 1.01 (ref 1.005–1.030)
Urobilinogen, UA: 0.2 mg/dL (ref 0.0–1.0)
pH: 6.5 (ref 5.0–8.0)

## 2012-10-12 NOTE — ED Provider Notes (Signed)
History     CSN: [id number]  Arrival date & time 10/12/12  1806   First MD Initiated Contact with Patient 10/12/12 1828      Chief Complaint  Patient presents with  . Hypertension    (Consider location/radiation/quality/duration/timing/severity/associated sxs/prior treatment) HPI Comments: 57 y.o. M with PMHx significant for schizophrenia, HTN, DM presents for high blood pressure and blood sugars.  He was recently seen in the ER for DKA on 10/04/12 and admitted until 10/06/12; he had f/u with his PCP on 10/11/12.    He reports that his sugar was 491 a short period of time ago.  He took his insulin this morning-- he usually takes it 2 times per day, but it is not time for his 2nd dose.  He is drinking water but still feels like he has a dry mouth.  He feels like his vision is blurry.  No CP, dizziness, syncope.  Some SOB but this is stable.    He reports that he came in tonight because he called and talked to someone and they said to come in to be seen since he had a high CBG earlier today.  He increased his 70/30 to 28 units as instructed yesterday.   The history is provided by the patient.    Past Medical History  Diagnosis Date  . Schizophrenia, paranoid type     follows at [facility]  . Glaucoma   . Cataract     bilateral  . Decreased visual acuity     can only see well with right eye  . Tobacco abuse   . Hypertension   . Dysrhythmia   . Anxiety   . Depression   . Asthma   . Diabetes     dka 09/2012   . Headache     Past Surgical History  Procedure Laterality Date  . Total hip arthroplasty Bilateral 2004, 2006    2/2 injury from fall  . Knee cartilage surgery Right   . R rear finger      Family History  Problem Relation Age of Onset  . Coronary artery disease Sister     History  Substance Use Topics  . Smoking status: Current Every Day Smoker -- 0.50 packs/day for 40 years    Types: Cigarettes  . Smokeless tobacco: Former Neurosurgeon  . Alcohol Use: No       Review of Systems  Constitutional: Negative for fever.  HENT: Negative for congestion, sore throat and trouble swallowing.   Eyes: Positive for visual disturbance.  Respiratory: Negative for cough and shortness of breath.   Cardiovascular: Negative for chest pain.  Gastrointestinal: Negative for vomiting, abdominal pain and diarrhea.  Endocrine: Positive for polydipsia.  Genitourinary: Negative for dysuria.  Neurological: Negative for dizziness and syncope.    Allergies  Review of patient's allergies indicates no known allergies.  Home Medications   Current Outpatient Rx  Name  Route  Sig  Dispense  Refill  . divalproex (DEPAKOTE ER) 500 MG 24 hr tablet   Oral   Take 2,000 mg by mouth at bedtime.          Marland Kitchen FLUoxetine (PROZAC) 20 MG capsule   Oral   Take 20 mg by mouth daily.         . insulin aspart protamine-insulin aspart (NOVOLOG 70/30) (70-30) 100 UNIT/ML injection   Subcutaneous   Inject 28 Units into the skin 2 (two) times daily with a meal.   10 mL   5   .  living well with diabetes book MISC   Does not apply   1 each by Does not apply route once.         Marland Kitchen QUEtiapine (SEROQUEL XR) 300 MG 24 hr tablet   Oral   Take 600 mg by mouth at bedtime.           BP 118/85  Pulse 79  Temp(Src) 98.5 F (36.9 C) (Oral)  Resp 18  SpO2 100%  Physical Exam  Constitutional: He appears well-developed and well-nourished. No distress.  HENT:  Head: Normocephalic and atraumatic.  Eyes: EOM are normal. Pupils are equal, round, and reactive to light.  Neck: Normal range of motion.  Cardiovascular: Normal rate and regular rhythm.   Pulmonary/Chest: Effort normal. No respiratory distress.  Abdominal: Soft.  Neurological: He is alert.  Skin: Skin is warm and dry.    ED Course  Procedures (including critical care time)  Labs Reviewed  GLUCOSE, CAPILLARY - Abnormal; Notable for the following:    Glucose-Capillary 373 (*)    All other components within  normal limits  POCT URINALYSIS DIP (DEVICE) - Abnormal; Notable for the following:    Glucose, UA 500 (*)    All other components within normal limits   No results found.   1. Hyperglycemia   without evidence of DKA    MDM  CBG 373 Patient feels well and is able to po hydrate.  He would prefer to go home and f/u with his PCP tomorrow rather than go to ER now for IVF/insulin. Increase 70/30 to 32 units and f/u tomorrow for recheck.  He appears well, not dehydrated.  No ketones in his urine.  He can repeat back his new dose of insulin.  Red flags for return to ER discussed.         Tito Dine, MD 10/12/12 1952  Tito Dine, MD 10/12/12 3244

## 2012-10-12 NOTE — ED Notes (Addendum)
Concerned about his blood sugar is too high; NAD at present

## 2012-10-12 NOTE — ED Provider Notes (Signed)
Medical screening examination/treatment/procedure(s) were performed by resident physician or non-physician practitioner and as supervising physician I was immediately available for consultation/collaboration.   Barkley Bruns MD.   Linna Hoff, MD 10/12/12 2053

## 2012-10-18 ENCOUNTER — Ambulatory Visit: Payer: Self-pay

## 2012-12-05 ENCOUNTER — Encounter (HOSPITAL_COMMUNITY): Payer: Self-pay | Admitting: *Deleted

## 2012-12-05 ENCOUNTER — Emergency Department (HOSPITAL_COMMUNITY)
Admission: EM | Admit: 2012-12-05 | Discharge: 2012-12-05 | Disposition: A | Discharge status: Home / Self Care | Payer: Medicaid Other | Attending: Emergency Medicine | Admitting: Emergency Medicine

## 2012-12-05 DIAGNOSIS — I10 Essential (primary) hypertension: Secondary | ICD-10-CM | POA: Insufficient documentation

## 2012-12-05 DIAGNOSIS — F200 Paranoid schizophrenia: Secondary | ICD-10-CM | POA: Insufficient documentation

## 2012-12-05 DIAGNOSIS — F3289 Other specified depressive episodes: Secondary | ICD-10-CM | POA: Insufficient documentation

## 2012-12-05 DIAGNOSIS — R112 Nausea with vomiting, unspecified: Secondary | ICD-10-CM | POA: Insufficient documentation

## 2012-12-05 DIAGNOSIS — Z79899 Other long term (current) drug therapy: Secondary | ICD-10-CM | POA: Insufficient documentation

## 2012-12-05 DIAGNOSIS — F329 Major depressive disorder, single episode, unspecified: Secondary | ICD-10-CM | POA: Insufficient documentation

## 2012-12-05 DIAGNOSIS — Z794 Long term (current) use of insulin: Secondary | ICD-10-CM | POA: Insufficient documentation

## 2012-12-05 DIAGNOSIS — F17200 Nicotine dependence, unspecified, uncomplicated: Secondary | ICD-10-CM | POA: Insufficient documentation

## 2012-12-05 DIAGNOSIS — J45909 Unspecified asthma, uncomplicated: Secondary | ICD-10-CM | POA: Insufficient documentation

## 2012-12-05 DIAGNOSIS — Z8669 Personal history of other diseases of the nervous system and sense organs: Secondary | ICD-10-CM | POA: Insufficient documentation

## 2012-12-05 DIAGNOSIS — F411 Generalized anxiety disorder: Secondary | ICD-10-CM | POA: Insufficient documentation

## 2012-12-05 DIAGNOSIS — Z8679 Personal history of other diseases of the circulatory system: Secondary | ICD-10-CM | POA: Insufficient documentation

## 2012-12-05 DIAGNOSIS — E119 Type 2 diabetes mellitus without complications: Secondary | ICD-10-CM | POA: Insufficient documentation

## 2012-12-05 LAB — BASIC METABOLIC PANEL
BUN: 13 mg/dL (ref 6–23)
CO2: 14 mEq/L — ABNORMAL LOW (ref 19–32)
Calcium: 9.2 mg/dL (ref 8.4–10.5)
Chloride: 106 mEq/L (ref 96–112)
Creatinine, Ser: 1 mg/dL (ref 0.50–1.35)
GFR calc Af Amer: >90 mL/min (ref >90)
GFR calc non Af Amer: 82 mL/min — ABNORMAL LOW (ref >90)
Glucose, Bld: 111 mg/dL — ABNORMAL HIGH (ref 70–99)
Potassium: 6 mEq/L — ABNORMAL HIGH (ref 3.5–5.1)
Sodium: 140 mEq/L (ref 135–145)

## 2012-12-05 LAB — URINALYSIS, ROUTINE W REFLEX MICROSCOPIC
Bilirubin Urine: NEGATIVE
Glucose, UA: NEGATIVE mg/dL
Hgb urine dipstick: NEGATIVE
Ketones, ur: 15 mg/dL — AB
Leukocytes, UA: NEGATIVE
Nitrite: NEGATIVE
Protein, ur: NEGATIVE mg/dL
Specific Gravity, Urine: 1.028 (ref 1.005–1.030)
Urobilinogen, UA: 1 mg/dL (ref 0.0–1.0)
pH: 6 (ref 5.0–8.0)

## 2012-12-05 LAB — CBC WITH DIFFERENTIAL/PLATELET
Basophils Absolute: 0 10*3/uL (ref 0.0–0.1)
Basophils Relative: 0 % (ref 0–1)
Eosinophils Absolute: 0 10*3/uL (ref 0.0–0.7)
Eosinophils Relative: 0 % (ref 0–5)
HCT: 46.5 % (ref 39.0–52.0)
Hemoglobin: 16.7 g/dL (ref 13.0–17.0)
Lymphocytes Relative: 21 % (ref 12–46)
Lymphs Abs: 1.1 10*3/uL (ref 0.7–4.0)
MCH: 32.6 pg (ref 26.0–34.0)
MCHC: 35.9 g/dL (ref 30.0–36.0)
MCV: 90.8 fL (ref 78.0–100.0)
Monocytes Absolute: 0.3 10*3/uL (ref 0.1–1.0)
Monocytes Relative: 5 % (ref 3–12)
Neutro Abs: 3.9 10*3/uL (ref 1.7–7.7)
Neutrophils Relative %: 74 % (ref 43–77)
Platelets: 173 10*3/uL (ref 150–400)
RBC: 5.12 MIL/uL (ref 4.22–5.81)
RDW: 13.8 % (ref 11.5–15.5)
WBC: 5.3 10*3/uL (ref 4.0–10.5)

## 2012-12-05 LAB — POTASSIUM: Potassium: 4.1 mEq/L (ref 3.5–5.1)

## 2012-12-05 LAB — VALPROIC ACID LEVEL: Valproic Acid Lvl: <10 ug/mL — ABNORMAL LOW (ref 50.0–100.0)

## 2012-12-05 MED ORDER — PROMETHAZINE HCL 25 MG/ML IJ SOLN
12.5000 mg | Freq: Once | INTRAMUSCULAR | Status: AC
  Administered 2012-12-05: 12.5 mg via INTRAVENOUS
  Filled 2012-12-05: qty 1

## 2012-12-05 MED ORDER — PROMETHAZINE HCL 25 MG PO TABS: 25.0000 mg | ORAL_TABLET | Freq: Four times a day (QID) | ORAL | Status: DC | PRN

## 2012-12-05 MED ORDER — SODIUM CHLORIDE 0.9 % IV BOLUS (SEPSIS)
1000.0000 mL | Freq: Once | INTRAVENOUS | Status: AC
  Administered 2012-12-05: 1000 mL via INTRAVENOUS

## 2012-12-05 MED ORDER — ONDANSETRON HCL 4 MG/2ML IJ SOLN
4.0000 mg | Freq: Once | INTRAMUSCULAR | Status: AC
  Administered 2012-12-05: 4 mg via INTRAVENOUS
  Filled 2012-12-05: qty 2

## 2012-12-05 NOTE — ED Notes (Signed)
N/V for the past cple days. zofran and 500 cc NS given enroute.

## 2012-12-05 NOTE — ED Provider Notes (Signed)
History     CSN: [id number]  Arrival date & time 12/05/12  1748   First MD Initiated Contact with Patient 12/05/12 1821      Chief Complaint  Patient presents with  . Nausea  . Emesis    (Consider location/radiation/quality/duration/timing/severity/associated sxs/prior treatment) Patient is a 57 y.o. male presenting with vomiting. The history is provided by the patient. No language interpreter was used.  Emesis Severity:  Moderate Associated symptoms: no abdominal pain, no diarrhea and no myalgias   Associated symptoms comment:  Nausea and vomiting for the past 1-2 days without abdominal pain, diarrhea, fever, cough or chest pain.    Past Medical History  Diagnosis Date  . Schizophrenia, paranoid type     follows at [facility]  . Glaucoma   . Cataract     bilateral  . Decreased visual acuity     can only see well with right eye  . Tobacco abuse   . Hypertension   . Dysrhythmia   . Anxiety   . Depression   . Asthma   . Diabetes     dka 09/2012   . Headache     Past Surgical History  Procedure Laterality Date  . Total hip arthroplasty Bilateral 2004, 2006    2/2 injury from fall  . Knee cartilage surgery Right   . R rear finger      Family History  Problem Relation Age of Onset  . Coronary artery disease Sister     History  Substance Use Topics  . Smoking status: Current Every Day Smoker -- 0.50 packs/day for 40 years    Types: Cigarettes  . Smokeless tobacco: Former Neurosurgeon  . Alcohol Use: No      Review of Systems  Constitutional: Negative for fever.  Respiratory: Negative for cough and shortness of breath.   Cardiovascular: Negative for chest pain.  Gastrointestinal: Positive for nausea and vomiting. Negative for abdominal pain and diarrhea.  Genitourinary: Negative for dysuria.  Musculoskeletal: Negative for myalgias and back pain.  Skin: Negative for rash.  Neurological: Negative for weakness and light-headedness.  Psychiatric/Behavioral:  Negative for confusion.    Allergies  Review of patient's allergies indicates no known allergies.  Home Medications   Current Outpatient Rx  Name  Route  Sig  Dispense  Refill  . atenolol (TENORMIN) 25 MG tablet   Oral   Take 25 mg by mouth 2 (two) times daily.         . divalproex (DEPAKOTE ER) 500 MG 24 hr tablet   Oral   Take 2,000 mg by mouth 2 (two) times daily.          Marland Kitchen FLUoxetine (PROZAC) 20 MG capsule   Oral   Take 40 mg by mouth daily.          . insulin aspart protamine- aspart (NOVOLOG 70/30) (70-30) 100 UNIT/ML injection   Subcutaneous   Inject 32 Units into the skin 2 (two) times daily with a meal.         . QUEtiapine (SEROQUEL XR) 400 MG 24 hr tablet   Oral   Take 800 mg by mouth 2 (two) times daily.           BP 148/61  Temp(Src) 98.2 F (36.8 C) (Oral)  Ht 6' (1.829 m)  Wt 180 lb (81.647 kg)  BMI 24.41 kg/m2  SpO2 98%  Physical Exam  Constitutional: He is oriented to person, place, and time. He appears well-developed and well-nourished. No distress.  HENT:  Head: Normocephalic.  Neck: Normal range of motion. Neck supple.  Cardiovascular: Normal rate and regular rhythm.   No murmur heard. Pulmonary/Chest: Effort normal and breath sounds normal. He has no wheezes. He has no rales.  Abdominal: Soft. Bowel sounds are normal. There is no tenderness. There is no rebound and no guarding.  Musculoskeletal: Normal range of motion.  Neurological: He is alert and oriented to person, place, and time.  Skin: Skin is warm and dry. No rash noted.  Psychiatric: He has a normal mood and affect.    ED Course  Procedures (including critical care time)  Labs Reviewed  BASIC METABOLIC PANEL - Abnormal; Notable for the following:    Potassium 6.0 (*)    CO2 14 (*)    Glucose, Bld 111 (*)    GFR calc non Af Amer 82 (*)    All other components within normal limits  URINALYSIS, ROUTINE W REFLEX MICROSCOPIC - Abnormal; Notable for the following:     Ketones, ur 15 (*)    All other components within normal limits  VALPROIC ACID LEVEL   No results found.   No diagnosis found.  1. N, V  MDM  Feeling much better after medications given. Depakote level checked as reported dose is high, but level is low so not thought the cause of N, V. He is tolerating PO fluids without further vomiting or recurrent nausea. Stable for discharge.         Arnoldo Hooker, PA-C 12/05/12 2213

## 2012-12-05 NOTE — ED Notes (Signed)
C/O nausea and vomiting X 2 days. To [facility] ED via EMS.  Given saline bolus. Patient states that he feels better.

## 2012-12-05 NOTE — ED Notes (Signed)
Patient states that he has been vomiting since yesterday. States that the vomitus was billious.  C/O having the dry heaves when he had nothing else in his stomach.  States that he last vomited 1 hour ago.  Patient's abdomen is tender to palpation in his RLQ and epigastric areas. Denies bloody emesis and diarrhea.

## 2012-12-05 NOTE — ED Notes (Signed)
Patient given some ginger ale to drink. 

## 2012-12-06 ENCOUNTER — Telehealth (HOSPITAL_COMMUNITY): Payer: Self-pay | Admitting: Emergency Medicine

## 2012-12-06 NOTE — ED Provider Notes (Signed)
Medical screening examination/treatment/procedure(s) were performed by non-physician practitioner and as supervising physician I was immediately available for consultation/collaboration.  Kolette Vey T Epimenio Schetter, MD 12/06/12 2303 

## 2013-07-12 ENCOUNTER — Emergency Department (HOSPITAL_BASED_OUTPATIENT_CLINIC_OR_DEPARTMENT_OTHER)
Admission: EM | Admit: 2013-07-12 | Discharge: 2013-07-12 | Disposition: A | Discharge status: Home / Self Care | Payer: Medicaid Other | Attending: Emergency Medicine | Admitting: Emergency Medicine

## 2013-07-12 ENCOUNTER — Encounter (HOSPITAL_BASED_OUTPATIENT_CLINIC_OR_DEPARTMENT_OTHER): Payer: Self-pay | Admitting: Emergency Medicine

## 2013-07-12 ENCOUNTER — Emergency Department (HOSPITAL_BASED_OUTPATIENT_CLINIC_OR_DEPARTMENT_OTHER): Payer: Medicaid Other

## 2013-07-12 DIAGNOSIS — S0501XA Injury of conjunctiva and corneal abrasion without foreign body, right eye, initial encounter: Secondary | ICD-10-CM

## 2013-07-12 DIAGNOSIS — F17200 Nicotine dependence, unspecified, uncomplicated: Secondary | ICD-10-CM | POA: Insufficient documentation

## 2013-07-12 DIAGNOSIS — E119 Type 2 diabetes mellitus without complications: Secondary | ICD-10-CM | POA: Insufficient documentation

## 2013-07-12 DIAGNOSIS — J45909 Unspecified asthma, uncomplicated: Secondary | ICD-10-CM | POA: Insufficient documentation

## 2013-07-12 DIAGNOSIS — I10 Essential (primary) hypertension: Secondary | ICD-10-CM | POA: Insufficient documentation

## 2013-07-12 DIAGNOSIS — Z79899 Other long term (current) drug therapy: Secondary | ICD-10-CM | POA: Insufficient documentation

## 2013-07-12 DIAGNOSIS — S0510XA Contusion of eyeball and orbital tissues, unspecified eye, initial encounter: Secondary | ICD-10-CM | POA: Insufficient documentation

## 2013-07-12 DIAGNOSIS — Z794 Long term (current) use of insulin: Secondary | ICD-10-CM | POA: Insufficient documentation

## 2013-07-12 DIAGNOSIS — F329 Major depressive disorder, single episode, unspecified: Secondary | ICD-10-CM | POA: Insufficient documentation

## 2013-07-12 DIAGNOSIS — F3289 Other specified depressive episodes: Secondary | ICD-10-CM | POA: Insufficient documentation

## 2013-07-12 DIAGNOSIS — S058X9A Other injuries of unspecified eye and orbit, initial encounter: Secondary | ICD-10-CM | POA: Insufficient documentation

## 2013-07-12 DIAGNOSIS — H409 Unspecified glaucoma: Secondary | ICD-10-CM | POA: Insufficient documentation

## 2013-07-12 DIAGNOSIS — H269 Unspecified cataract: Secondary | ICD-10-CM | POA: Insufficient documentation

## 2013-07-12 DIAGNOSIS — F200 Paranoid schizophrenia: Secondary | ICD-10-CM | POA: Insufficient documentation

## 2013-07-12 DIAGNOSIS — T148XXA Other injury of unspecified body region, initial encounter: Secondary | ICD-10-CM

## 2013-07-12 DIAGNOSIS — F411 Generalized anxiety disorder: Secondary | ICD-10-CM | POA: Insufficient documentation

## 2013-07-12 LAB — BASIC METABOLIC PANEL
BUN: 6 mg/dL (ref 6–23)
CO2: 26 mEq/L (ref 19–32)
Chloride: 100 mEq/L (ref 96–112)
Creatinine, Ser: 0.9 mg/dL (ref 0.50–1.35)
Glucose, Bld: 95 mg/dL (ref 70–99)

## 2013-07-12 LAB — CBC WITH DIFFERENTIAL/PLATELET
HCT: 47.4 % (ref 39.0–52.0)
Hemoglobin: 16.3 g/dL (ref 13.0–17.0)
Lymphocytes Relative: 41 % (ref 12–46)
Monocytes Absolute: 0.5 10*3/uL (ref 0.1–1.0)
Monocytes Relative: 12 % (ref 3–12)
Neutro Abs: 2 10*3/uL (ref 1.7–7.7)
WBC: 4.4 10*3/uL (ref 4.0–10.5)

## 2013-07-12 MED ORDER — IOHEXOL 300 MG/ML  SOLN
80.0000 mL | Freq: Once | INTRAMUSCULAR | Status: AC | PRN
  Administered 2013-07-12: 80 mL via INTRAVENOUS

## 2013-07-12 MED ORDER — TETRACAINE HCL 0.5 % OP SOLN
1.0000 [drp] | Freq: Once | OPHTHALMIC | Status: AC
  Administered 2013-07-12: 1 [drp] via OPHTHALMIC
  Filled 2013-07-12: qty 2

## 2013-07-12 MED ORDER — FLUORESCEIN SODIUM 1 MG OP STRP
1.0000 | ORAL_STRIP | Freq: Once | OPHTHALMIC | Status: AC
  Administered 2013-07-12: 1 via OPHTHALMIC
  Filled 2013-07-12: qty 1

## 2013-07-12 MED ORDER — ERYTHROMYCIN 5 MG/GM OP OINT: TOPICAL_OINTMENT | OPHTHALMIC | Status: DC
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
## 2013-09-18 ENCOUNTER — Encounter (HOSPITAL_COMMUNITY): Payer: Self-pay | Admitting: Emergency Medicine

## 2013-09-18 ENCOUNTER — Emergency Department (HOSPITAL_COMMUNITY)
Admission: EM | Admit: 2013-09-18 | Discharge: 2013-09-18 | Disposition: A | Discharge status: Home / Self Care | Payer: Medicaid Other | Attending: Emergency Medicine | Admitting: Emergency Medicine

## 2013-09-18 DIAGNOSIS — F329 Major depressive disorder, single episode, unspecified: Secondary | ICD-10-CM | POA: Insufficient documentation

## 2013-09-18 DIAGNOSIS — Z79899 Other long term (current) drug therapy: Secondary | ICD-10-CM | POA: Insufficient documentation

## 2013-09-18 DIAGNOSIS — E119 Type 2 diabetes mellitus without complications: Secondary | ICD-10-CM | POA: Insufficient documentation

## 2013-09-18 DIAGNOSIS — I499 Cardiac arrhythmia, unspecified: Secondary | ICD-10-CM | POA: Insufficient documentation

## 2013-09-18 DIAGNOSIS — F3289 Other specified depressive episodes: Secondary | ICD-10-CM | POA: Insufficient documentation

## 2013-09-18 DIAGNOSIS — F17200 Nicotine dependence, unspecified, uncomplicated: Secondary | ICD-10-CM | POA: Insufficient documentation

## 2013-09-18 DIAGNOSIS — Z794 Long term (current) use of insulin: Secondary | ICD-10-CM | POA: Insufficient documentation

## 2013-09-18 DIAGNOSIS — F200 Paranoid schizophrenia: Secondary | ICD-10-CM | POA: Insufficient documentation

## 2013-09-18 DIAGNOSIS — J45909 Unspecified asthma, uncomplicated: Secondary | ICD-10-CM | POA: Insufficient documentation

## 2013-09-18 DIAGNOSIS — F411 Generalized anxiety disorder: Secondary | ICD-10-CM | POA: Insufficient documentation

## 2013-09-18 DIAGNOSIS — R112 Nausea with vomiting, unspecified: Secondary | ICD-10-CM

## 2013-09-18 DIAGNOSIS — I10 Essential (primary) hypertension: Secondary | ICD-10-CM | POA: Insufficient documentation

## 2013-09-18 DIAGNOSIS — Z792 Long term (current) use of antibiotics: Secondary | ICD-10-CM | POA: Insufficient documentation

## 2013-09-18 DIAGNOSIS — H269 Unspecified cataract: Secondary | ICD-10-CM | POA: Insufficient documentation

## 2013-09-18 DIAGNOSIS — H409 Unspecified glaucoma: Secondary | ICD-10-CM | POA: Insufficient documentation

## 2013-09-18 LAB — CBC WITH DIFFERENTIAL/PLATELET
BASOS PCT: 1 % (ref 0–1)
Basophils Absolute: 0 10*3/uL (ref 0.0–0.1)
EOS ABS: 0 10*3/uL (ref 0.0–0.7)
Eosinophils Relative: 0 % (ref 0–5)
HCT: 49.7 % (ref 39.0–52.0)
HEMOGLOBIN: 17.8 g/dL — AB (ref 13.0–17.0)
LYMPHS ABS: 0.6 10*3/uL — AB (ref 0.7–4.0)
Lymphocytes Relative: 14 % (ref 12–46)
MCH: 32.4 pg (ref 26.0–34.0)
MCHC: 35.8 g/dL (ref 30.0–36.0)
MCV: 90.5 fL (ref 78.0–100.0)
MONOS PCT: 8 % (ref 3–12)
Monocytes Absolute: 0.4 10*3/uL (ref 0.1–1.0)
NEUTROS ABS: 3.3 10*3/uL (ref 1.7–7.7)
NEUTROS PCT: 76 % (ref 43–77)
PLATELETS: 211 10*3/uL (ref 150–400)
RBC: 5.49 MIL/uL (ref 4.22–5.81)
RDW: 13 % (ref 11.5–15.5)
WBC: 4.4 10*3/uL (ref 4.0–10.5)

## 2013-09-18 LAB — BLOOD GAS, VENOUS
Acid-Base Excess: 4.5 mmol/L — ABNORMAL HIGH (ref 0.0–2.0)
Bicarbonate: 28.3 mEq/L — ABNORMAL HIGH (ref 20.0–24.0)
DRAWN BY: 295031
FIO2: 0.21 %
O2 Saturation: 68.7 %
PCO2 VEN: 41 mmHg — AB (ref 45.0–50.0)
PH VEN: 7.455 — AB (ref 7.250–7.300)
Patient temperature: 98.6
TCO2: 23.3 mmol/L (ref 0–100)
pO2, Ven: 39.2 mmHg (ref 30.0–45.0)

## 2013-09-18 LAB — URINE MICROSCOPIC-ADD ON

## 2013-09-18 LAB — COMPREHENSIVE METABOLIC PANEL
ALBUMIN: 4.2 g/dL (ref 3.5–5.2)
ALK PHOS: 103 U/L (ref 39–117)
ALT: 51 U/L (ref 0–53)
AST: 62 U/L — ABNORMAL HIGH (ref 0–37)
BILIRUBIN TOTAL: 1.2 mg/dL (ref 0.3–1.2)
BUN: 9 mg/dL (ref 6–23)
CHLORIDE: 94 meq/L — AB (ref 96–112)
CO2: 27 mEq/L (ref 19–32)
Calcium: 10 mg/dL (ref 8.4–10.5)
Creatinine, Ser: 0.84 mg/dL (ref 0.50–1.35)
GFR calc Af Amer: >90 mL/min (ref >90)
GFR calc non Af Amer: >90 mL/min (ref >90)
GLUCOSE: 134 mg/dL — AB (ref 70–99)
POTASSIUM: 3.5 meq/L — AB (ref 3.7–5.3)
SODIUM: 138 meq/L (ref 137–147)
TOTAL PROTEIN: 8.2 g/dL (ref 6.0–8.3)

## 2013-09-18 LAB — URINALYSIS, ROUTINE W REFLEX MICROSCOPIC
Glucose, UA: NEGATIVE mg/dL
HGB URINE DIPSTICK: NEGATIVE
KETONES UR: 15 mg/dL — AB
Leukocytes, UA: NEGATIVE
Nitrite: NEGATIVE
PH: 6.5 (ref 5.0–8.0)
Protein, ur: 30 mg/dL — AB
SPECIFIC GRAVITY, URINE: 1.022 (ref 1.005–1.030)
Urobilinogen, UA: 1 mg/dL (ref 0.0–1.0)

## 2013-09-18 LAB — I-STAT CG4 LACTIC ACID, ED: LACTIC ACID, VENOUS: 1.83 mmol/L (ref 0.5–2.2)

## 2013-09-18 LAB — LIPASE, BLOOD: Lipase: 21 U/L (ref 11–59)

## 2013-09-18 MED ORDER — SODIUM CHLORIDE 0.9 % IV BOLUS (SEPSIS)
1000.0000 mL | Freq: Once | INTRAVENOUS | Status: AC
  Administered 2013-09-18: 1000 mL via INTRAVENOUS

## 2013-09-18 MED ORDER — ONDANSETRON HCL 4 MG/2ML IJ SOLN
4.0000 mg | Freq: Once | INTRAMUSCULAR | Status: AC
  Administered 2013-09-18: 4 mg via INTRAVENOUS
  Filled 2013-09-18: qty 2

## 2013-09-18 MED ORDER — PROMETHAZINE HCL 25 MG/ML IJ SOLN
25.0000 mg | Freq: Once | INTRAMUSCULAR | Status: AC
Start: 2013-09-18 — End: 2013-09-18
  Administered 2013-09-18: 25 mg via INTRAVENOUS
  Filled 2013-09-18: qty 1

## 2013-09-18 MED ORDER — PROMETHAZINE HCL 25 MG PO TABS: 25.0000 mg | ORAL_TABLET | Freq: Four times a day (QID) | ORAL | Status: DC | PRN

## 2013-09-18 NOTE — ED Notes (Signed)
Gave patient a sprite and some crackers per MD

## 2013-09-18 NOTE — ED Notes (Signed)
Patient is aware that we need a urine specimen. Urinal at bedside. 

## 2013-09-18 NOTE — ED Provider Notes (Addendum)
CSN: [id number]     Arrival date & time 09/18/13  1058 History   First MD Initiated Contact with Patient 09/18/13 1123     Chief Complaint  Patient presents with  . Emesis     (Consider location/radiation/quality/duration/timing/severity/associated sxs/prior Treatment) Patient is a 58 y.o. male presenting with vomiting. The history is provided by the patient.  Emesis Severity:  Severe Duration:  2 days Timing:  Constant Number of daily episodes:  Numerous Quality:  Stomach contents (dry heaving) Progression:  Unchanged Chronicity:  Recurrent Recent urination:  Decreased Relieved by:  Nothing Worsened by:  Liquids Associated symptoms: no abdominal pain, no chills, no cough, no diarrhea and no fever   Associated symptoms comment:  No SOB or CP.  Normal bm's and last BM was today Risk factors: alcohol use and diabetes   Risk factors: no prior abdominal surgery, no sick contacts and no suspect food intake     Past Medical History  Diagnosis Date  . Schizophrenia, paranoid type     follows at [facility]  . Glaucoma   . Cataract     bilateral  . Decreased visual acuity     can only see well with right eye  . Tobacco abuse   . Hypertension   . Dysrhythmia   . Anxiety   . Depression   . Asthma   . Diabetes     dka 09/2012   . WUJWJXBJ(478.2)    Past Surgical History  Procedure Laterality Date  . Total hip arthroplasty Bilateral 2004, 2006    2/2 injury from fall  . Knee cartilage surgery Right   . R rear finger     Family History  Problem Relation Age of Onset  . Coronary artery disease Sister    History  Substance Use Topics  . Smoking status: Current Every Day Smoker -- 0.50 packs/day for 40 years    Types: Cigarettes  . Smokeless tobacco: Former Neurosurgeon  . Alcohol Use: Yes    Review of Systems  Constitutional: Negative for chills.  Gastrointestinal: Positive for vomiting. Negative for abdominal pain and diarrhea.  All other systems reviewed and are  negative.      Allergies  Review of patient's allergies indicates no known allergies.  Home Medications   Current Outpatient Rx  Name  Route  Sig  Dispense  Refill  . atenolol (TENORMIN) 25 MG tablet   Oral   Take 25 mg by mouth 2 (two) times daily.         . divalproex (DEPAKOTE ER) 500 MG 24 hr tablet   Oral   Take 2,000 mg by mouth 2 (two) times daily.          Marland Kitchen erythromycin ophthalmic ointment      1 application at bedtime.         Marland Kitchen erythromycin ophthalmic ointment      Place a 1/2 inch ribbon of ointment into the lower eyelid.   1 g   0   . FLUoxetine (PROZAC) 20 MG capsule   Oral   Take 40 mg by mouth daily.          . insulin aspart protamine- aspart (NOVOLOG 70/30) (70-30) 100 UNIT/ML injection   Subcutaneous   Inject 32 Units into the skin 2 (two) times daily with a meal.         . promethazine (PHENERGAN) 25 MG tablet   Oral   Take 1 tablet (25 mg total) by mouth every 6 (six) hours as  needed for nausea.   12 tablet   0   . QUEtiapine (SEROQUEL XR) 400 MG 24 hr tablet   Oral   Take 800 mg by mouth 2 (two) times daily.         . traMADol (ULTRAM) 50 MG tablet   Oral   Take by mouth 2 (two) times daily.          BP 151/96  Pulse 60  Temp(Src) 98.5 F (36.9 C) (Oral)  Resp 16  SpO2 99% Physical Exam  Nursing note and vitals reviewed. Constitutional: He is oriented to person, place, and time. He appears well-developed and well-nourished. No distress.  HENT:  Head: Normocephalic and atraumatic.  Mouth/Throat: Oropharynx is clear and moist. Mucous membranes are dry.  Eyes: Conjunctivae and EOM are normal. Pupils are equal, round, and reactive to light.  Neck: Normal range of motion. Neck supple.  Cardiovascular: Normal rate, regular rhythm and intact distal pulses.   No murmur heard. Pulmonary/Chest: Effort normal and breath sounds normal. No respiratory distress. He has no wheezes. He has no rales.  Abdominal: Soft. Bowel  sounds are normal. He exhibits no distension. There is no tenderness. There is no rebound and no guarding.  Musculoskeletal: Normal range of motion. He exhibits no edema and no tenderness.  Neurological: He is alert and oriented to person, place, and time.  Skin: Skin is warm and dry. No rash noted. No erythema.  Psychiatric: He has a normal mood and affect. His behavior is normal.    ED Course  Procedures (including critical care time) Labs Review Labs Reviewed  CBC WITH DIFFERENTIAL - Abnormal; Notable for the following:    Hemoglobin 17.8 (*)    Lymphs Abs 0.6 (*)    All other components within normal limits  COMPREHENSIVE METABOLIC PANEL - Abnormal; Notable for the following:    Potassium 3.5 (*)    Chloride 94 (*)    Glucose, Bld 134 (*)    AST 62 (*)    All other components within normal limits  URINALYSIS, ROUTINE W REFLEX MICROSCOPIC - Abnormal; Notable for the following:    Color, Urine AMBER (*)    APPearance CLOUDY (*)    Bilirubin Urine SMALL (*)    Ketones, ur 15 (*)    Protein, ur 30 (*)    All other components within normal limits  BLOOD GAS, VENOUS - Abnormal; Notable for the following:    pH, Ven 7.455 (*)    pCO2, Ven 41.0 (*)    Bicarbonate 28.3 (*)    Acid-Base Excess 4.5 (*)    All other components within normal limits  URINE MICROSCOPIC-ADD ON - Abnormal; Notable for the following:    Bacteria, UA FEW (*)    All other components within normal limits  LIPASE, BLOOD  I-STAT CG4 LACTIC ACID, ED   Imaging Review No results found.   EKG Interpretation   Date/Time:  Monday September 18 2013 11:45:28 EST Ventricular Rate:  60 PR Interval:  126 QRS Duration: 83 QT Interval:  429 QTC Calculation: 429 R Axis:   30 Text Interpretation:  Sinus rhythm Probable left atrial enlargement No  significant change since last tracing Confirmed by Anitra LauthPLUNKETT  MD, Alphonzo LemmingsWHITNEY  [phone]([id number]) on 09/18/2013 11:49:06 AM      MDM   Final diagnoses:  Nausea and vomiting in adult     Patient with nausea and vomiting for the last 2 days unable to hold anything down. He has a history of diabetes but  states he's been off medication for several months. He also drinks alcohol but states he drinks 2 beers a day and did not go through withdrawal when he does not have alcohol. He denies any drug or alcohol use. He is having normal bowel movements but no focal abdominal tenderness. He denies any infectious symptoms. Patient does have a prior history of hyperglycemia with nausea and vomiting. He currently is not taking Depakote so we'll suspicion for Depakote toxicity.  Concern for hypoglycemia, possible DKA, dehydration, pancreatitis. Low suspicion for small bowel obstruction given normal bowel sounds are normal bowel movements.  Patient given IV fluids and nausea medication. CBC, CMP, lipase, UA, EKG, lactate pending.  1:51 PM hemoconcentrated but o/w normal labs and urine.  No focal abd pain and mild improvement after zofran.  Pt given phenergan and will attempt to po challenge.  No evidence of DKA.  2:54 PM Pt feeling better and po challenge now.  Gwyneth Sprout, MD 09/18/13 1455  Gwyneth Sprout, MD 09/18/13 1510

## 2013-09-18 NOTE — ED Notes (Signed)
Pt states he has had trouble keeping food down and nausea for the past 2 days. Pt denies abdominal pain.

## 2013-09-18 NOTE — Discharge Instructions (Signed)

## 2013-11-21 ENCOUNTER — Emergency Department (HOSPITAL_COMMUNITY)
Admission: EM | Admit: 2013-11-21 | Discharge: 2013-11-21 | Disposition: A | Discharge status: Home / Self Care | Payer: Medicaid Other | Attending: Emergency Medicine | Admitting: Emergency Medicine

## 2013-11-21 ENCOUNTER — Encounter (HOSPITAL_COMMUNITY): Payer: Self-pay | Admitting: Emergency Medicine

## 2013-11-21 DIAGNOSIS — R7401 Elevation of levels of liver transaminase levels: Secondary | ICD-10-CM | POA: Insufficient documentation

## 2013-11-21 DIAGNOSIS — E119 Type 2 diabetes mellitus without complications: Secondary | ICD-10-CM | POA: Insufficient documentation

## 2013-11-21 DIAGNOSIS — F17200 Nicotine dependence, unspecified, uncomplicated: Secondary | ICD-10-CM | POA: Insufficient documentation

## 2013-11-21 DIAGNOSIS — I10 Essential (primary) hypertension: Secondary | ICD-10-CM | POA: Insufficient documentation

## 2013-11-21 DIAGNOSIS — F329 Major depressive disorder, single episode, unspecified: Secondary | ICD-10-CM | POA: Insufficient documentation

## 2013-11-21 DIAGNOSIS — J45909 Unspecified asthma, uncomplicated: Secondary | ICD-10-CM | POA: Insufficient documentation

## 2013-11-21 DIAGNOSIS — R112 Nausea with vomiting, unspecified: Secondary | ICD-10-CM | POA: Insufficient documentation

## 2013-11-21 DIAGNOSIS — R7402 Elevation of levels of lactic acid dehydrogenase (LDH): Secondary | ICD-10-CM | POA: Insufficient documentation

## 2013-11-21 DIAGNOSIS — Z8669 Personal history of other diseases of the nervous system and sense organs: Secondary | ICD-10-CM | POA: Insufficient documentation

## 2013-11-21 DIAGNOSIS — F411 Generalized anxiety disorder: Secondary | ICD-10-CM | POA: Insufficient documentation

## 2013-11-21 DIAGNOSIS — F200 Paranoid schizophrenia: Secondary | ICD-10-CM | POA: Insufficient documentation

## 2013-11-21 DIAGNOSIS — Z79899 Other long term (current) drug therapy: Secondary | ICD-10-CM | POA: Insufficient documentation

## 2013-11-21 DIAGNOSIS — F3289 Other specified depressive episodes: Secondary | ICD-10-CM | POA: Insufficient documentation

## 2013-11-21 DIAGNOSIS — R740 Nonspecific elevation of levels of transaminase and lactic acid dehydrogenase [LDH]: Secondary | ICD-10-CM

## 2013-11-21 LAB — CBC WITH DIFFERENTIAL/PLATELET
Basophils Absolute: 0 10*3/uL (ref 0.0–0.1)
Basophils Relative: 0 % (ref 0–1)
EOS ABS: 0 10*3/uL (ref 0.0–0.7)
EOS PCT: 1 % (ref 0–5)
HEMATOCRIT: 44.8 % (ref 39.0–52.0)
HEMOGLOBIN: 16.1 g/dL (ref 13.0–17.0)
LYMPHS PCT: 34 % (ref 12–46)
Lymphs Abs: 1.5 10*3/uL (ref 0.7–4.0)
MCH: 32.7 pg (ref 26.0–34.0)
MCHC: 35.9 g/dL (ref 30.0–36.0)
MCV: 90.9 fL (ref 78.0–100.0)
MONOS PCT: 9 % (ref 3–12)
Monocytes Absolute: 0.4 10*3/uL (ref 0.1–1.0)
Neutro Abs: 2.6 10*3/uL (ref 1.7–7.7)
Neutrophils Relative %: 56 % (ref 43–77)
Platelets: 222 10*3/uL (ref 150–400)
RBC: 4.93 MIL/uL (ref 4.22–5.81)
RDW: 12.4 % (ref 11.5–15.5)
WBC: 4.6 10*3/uL (ref 4.0–10.5)

## 2013-11-21 LAB — COMPREHENSIVE METABOLIC PANEL
ALK PHOS: 87 U/L (ref 39–117)
ALT: 63 U/L — AB (ref 0–53)
AST: 67 U/L — ABNORMAL HIGH (ref 0–37)
Albumin: 3.9 g/dL (ref 3.5–5.2)
BUN: 6 mg/dL (ref 6–23)
CALCIUM: 9.6 mg/dL (ref 8.4–10.5)
CO2: 28 meq/L (ref 19–32)
Chloride: 101 mEq/L (ref 96–112)
Creatinine, Ser: 1.04 mg/dL (ref 0.50–1.35)
GFR calc Af Amer: >90 mL/min (ref >90)
GFR, EST NON AFRICAN AMERICAN: 78 mL/min — AB (ref >90)
Glucose, Bld: 124 mg/dL — ABNORMAL HIGH (ref 70–99)
Potassium: 3.9 mEq/L (ref 3.7–5.3)
Sodium: 143 mEq/L (ref 137–147)
TOTAL PROTEIN: 7.9 g/dL (ref 6.0–8.3)
Total Bilirubin: 0.7 mg/dL (ref 0.3–1.2)

## 2013-11-21 LAB — CBG MONITORING, ED: Glucose-Capillary: 141 mg/dL — ABNORMAL HIGH (ref 70–99)

## 2013-11-21 LAB — VALPROIC ACID LEVEL: Valproic Acid Lvl: <10 ug/mL — ABNORMAL LOW (ref 50.0–100.0)

## 2013-11-21 LAB — I-STAT CG4 LACTIC ACID, ED: Lactic Acid, Venous: 2.02 mmol/L (ref 0.5–2.2)

## 2013-11-21 LAB — LIPASE, BLOOD: LIPASE: 21 U/L (ref 11–59)

## 2013-11-21 MED ORDER — LORAZEPAM 2 MG/ML IJ SOLN
1.0000 mg | Freq: Once | INTRAMUSCULAR | Status: AC
  Administered 2013-11-21: 1 mg via INTRAVENOUS
  Filled 2013-11-21: qty 1

## 2013-11-21 MED ORDER — DIPHENHYDRAMINE HCL 50 MG/ML IJ SOLN
25.0000 mg | Freq: Once | INTRAMUSCULAR | Status: AC
  Administered 2013-11-21: 25 mg via INTRAVENOUS
  Filled 2013-11-21: qty 1

## 2013-11-21 MED ORDER — METOCLOPRAMIDE HCL 5 MG/ML IJ SOLN
10.0000 mg | Freq: Once | INTRAMUSCULAR | Status: AC
  Administered 2013-11-21: 10 mg via INTRAVENOUS
  Filled 2013-11-21: qty 2

## 2013-11-21 MED ORDER — FAMOTIDINE IN NACL 20-0.9 MG/50ML-% IV SOLN
20.0000 mg | Freq: Once | INTRAVENOUS | Status: AC
  Administered 2013-11-21: 20 mg via INTRAVENOUS
  Filled 2013-11-21: qty 50

## 2013-11-21 MED ORDER — DIPHENHYDRAMINE HCL 50 MG/ML IJ SOLN: 25.0000 mg | Freq: Once | INTRAMUSCULAR | Status: DC

## 2013-11-21 MED ORDER — SODIUM CHLORIDE 0.9 % IV SOLN
1000.0000 mL | INTRAVENOUS | Status: DC
Start: 2013-11-21 — End: 2013-11-21
  Administered 2013-11-21: 1000 mL via INTRAVENOUS

## 2013-11-21 MED ORDER — SODIUM CHLORIDE 0.9 % IV SOLN
1000.0000 mL | Freq: Once | INTRAVENOUS | Status: AC
  Administered 2013-11-21: 1000 mL via INTRAVENOUS

## 2013-11-21 MED ORDER — ONDANSETRON HCL 4 MG PO TABS: 4.0000 mg | ORAL_TABLET | Freq: Three times a day (TID) | ORAL | Status: DC | PRN

## 2013-11-21 MED ORDER — SODIUM CHLORIDE 0.9 % IV SOLN
1000.0000 mL | Freq: Once | INTRAVENOUS | Status: AC
Start: 2013-11-21 — End: 2013-11-21
  Administered 2013-11-21: 1000 mL via INTRAVENOUS

## 2013-11-21 NOTE — ED Provider Notes (Signed)
CSN: [id number]     Arrival date & time 11/21/13  1543 History   First MD Initiated Contact with Patient 11/21/13 1547     Chief Complaint  Patient presents with  . Nausea     (Consider location/radiation/quality/duration/timing/severity/associated sxs/prior Treatment) HPI  Victor Savage is a(n) 58 y.o. male who presents to the emergency department with chief complaint of nausea and vomiting. He has a past medical history of paranoid schizophrenia, glaucoma, hypertension, diabetes. He had it may in for her DKA in March of 2014. Patient states that last night he drank 4  40 ounces of malt liquor. He states that every time he drinks beer he gets severe nausea and vomiting. He states that he has had innumerable amounts of vomiting since last night, unable to hold anything down. He denies any diarrhea, fever. He complains of abdominal pain associated only with retching and vomiting. He is unsure of what his sugars have been. He denies a history of pancreatitis or gastroparesis. He states that he feels he is going to pass out every time he stands.  Past Medical History  Diagnosis Date  . Schizophrenia, paranoid type     follows at [facility]  . Glaucoma   . Cataract     bilateral  . Decreased visual acuity     can only see well with right eye  . Tobacco abuse   . Hypertension   . Dysrhythmia   . Anxiety   . Depression   . Asthma   . Diabetes     dka 09/2012   . WUJWJXBJ(478.2Headache(784.0)    Past Surgical History  Procedure Laterality Date  . Total hip arthroplasty Bilateral 2004, 2006    2/2 injury from fall  . Knee cartilage surgery Right   . R rear finger     Family History  Problem Relation Age of Onset  . Coronary artery disease Sister    History  Substance Use Topics  . Smoking status: Current Every Day Smoker -- 0.50 packs/day for 40 years    Types: Cigarettes  . Smokeless tobacco: Former NeurosurgeonUser  . Alcohol Use: Yes    Review of Systems  Ten systems reviewed and are negative for  acute change, except as noted in the HPI.    Allergies  Review of patient's allergies indicates no known allergies.  Home Medications   Prior to Admission medications   Medication Sig Start Date End Date Taking? Authorizing Provider  atenolol (TENORMIN) 25 MG tablet Take 25 mg by mouth 2 (two) times daily.    Historical Provider, MD  divalproex (DEPAKOTE ER) 500 MG 24 hr tablet Take 2,000 mg by mouth 2 (two) times daily.     Historical Provider, MD  FLUoxetine (PROZAC) 20 MG capsule Take 40 mg by mouth daily.     Historical Provider, MD  promethazine (PHENERGAN) 25 MG tablet Take 1 tablet (25 mg total) by mouth every 6 (six) hours as needed for nausea or vomiting. 09/18/13   Gwyneth SproutWhitney Plunkett, MD  QUEtiapine (SEROQUEL XR) 400 MG 24 hr tablet Take 800 mg by mouth 2 (two) times daily.    Historical Provider, MD   BP 143/76  Pulse 62  Temp(Src) 97.9 F (36.6 C) (Oral)  Ht 6' (1.829 m)  Wt 187 lb (84.823 kg)  BMI 25.36 kg/m2  SpO2 100% Physical Exam Physical Exam  Nursing note and vitals reviewed. Constitutional: He appears Dehydrated and uncomfortable. Lying in the fetal position. HENT:  Head: Normocephalic and atraumatic.  Eyes: Conjunctivae  normal are normal. No scleral icterus.  Neck: Normal range of motion. Neck supple.  Cardiovascular: Normal rate, regular rhythm and normal heart sounds.   Pulmonary/Chest: Effort normal and breath sounds normal. No respiratory distress.  Abdominal: Soft. There is no tenderness.  Musculoskeletal: He exhibits no edema.  Neurological: He is alert.  Skin: Skin is warm and dry. He is not diaphoretic.  Psychiatric: His behavior is normal.    ED Course  Procedures (including critical care time) Labs Review Labs Reviewed  CBG MONITORING, ED - Abnormal; Notable for the following:    Glucose-Capillary 141 (*)    All other components within normal limits  VALPROIC ACID LEVEL  CBC WITH DIFFERENTIAL  COMPREHENSIVE METABOLIC PANEL  LIPASE, BLOOD   I-STAT CG4 LACTIC ACID, ED    Imaging Review No results found.   EKG Interpretation None      MDM   Final diagnoses:  Nausea and vomiting    4:02 PM BP 143/76  Pulse 62  Temp(Src) 97.9 F (36.6 C) (Oral)  Ht 6' (1.829 m)  Wt 187 lb (84.823 kg)  BMI 25.36 kg/m2  SpO2 100% Patient with nausea ad vomiting. He has a blood sugar of 144. Awaiting labs. IV fluid resuscitation, reglan and benadryl initiated.  7:10 PM BP 157/81  Pulse 66  Temp(Src) 98.9 F (37.2 C) (Oral)  Resp 18  Ht 6' (1.829 m)  Wt 187 lb (84.823 kg)  BMI 25.36 kg/m2  SpO2 100% Patient with transaminitis, elevated glucose, and no other lab abnormalities. Suspect alcoholic gastritis secondary to large volume of alcohol consumed last night. Patient is nontoxic, nonseptic appearing, in no apparent distress.  Patient's pain and other symptoms adequately managed in emergency department.  Fluid bolus given.  Labs, imaging and vitals reviewed.  Patient does not meet the SIRS or Sepsis criteria.  On repeat exam patient does not have a surgical abdomin and there are nor peritoneal signs.  No indication of appendicitis, bowel obstruction, bowel perforation, cholecystitis, diverticulitis,  Patient discharged home with symptomatic treatment and given strict instructions for follow-up with their primary care physician.  I have also discussed reasons to return immediately to the ER.  Patient expresses understanding and agrees with plan.        Arthor Captainbigail Kali Ambler, PA-C 11/21/13 2137

## 2013-11-21 NOTE — Discharge Instructions (Signed)

## 2013-11-21 NOTE — ED Notes (Signed)
Per patient he has been feeling nauseous since last night. He states he begins to feel sick when he drinks alcohol. Per patient he drank approximately "4 fortys." Pt denies any other drug use. Pt states he has been dry heaving all night without any vomit. Pt denies any diarrhea.

## 2013-11-22 NOTE — ED Provider Notes (Signed)
Medical screening examination/treatment/procedure(s) were performed by non-physician practitioner and as supervising physician I was immediately available for consultation/collaboration.    Sachi Boulay D Jadyn Barge, MD 11/22/13 2107 

## 2015-03-13 ENCOUNTER — Emergency Department (HOSPITAL_BASED_OUTPATIENT_CLINIC_OR_DEPARTMENT_OTHER)
Admission: EM | Admit: 2015-03-13 | Discharge: 2015-03-13 | Discharge status: Against Medical Advice | Payer: Medicaid Other | Attending: Emergency Medicine | Admitting: Emergency Medicine

## 2015-03-13 ENCOUNTER — Encounter (HOSPITAL_BASED_OUTPATIENT_CLINIC_OR_DEPARTMENT_OTHER): Payer: Self-pay

## 2015-03-13 DIAGNOSIS — Y9289 Other specified places as the place of occurrence of the external cause: Secondary | ICD-10-CM | POA: Diagnosis not present

## 2015-03-13 DIAGNOSIS — Y998 Other external cause status: Secondary | ICD-10-CM | POA: Insufficient documentation

## 2015-03-13 DIAGNOSIS — Z590 Homelessness: Secondary | ICD-10-CM | POA: Insufficient documentation

## 2015-03-13 DIAGNOSIS — F1010 Alcohol abuse, uncomplicated: Secondary | ICD-10-CM | POA: Diagnosis not present

## 2015-03-13 DIAGNOSIS — W1839XA Other fall on same level, initial encounter: Secondary | ICD-10-CM | POA: Insufficient documentation

## 2015-03-13 DIAGNOSIS — J45909 Unspecified asthma, uncomplicated: Secondary | ICD-10-CM | POA: Insufficient documentation

## 2015-03-13 DIAGNOSIS — S3992XA Unspecified injury of lower back, initial encounter: Secondary | ICD-10-CM | POA: Insufficient documentation

## 2015-03-13 DIAGNOSIS — Y9389 Activity, other specified: Secondary | ICD-10-CM | POA: Diagnosis not present

## 2015-03-13 DIAGNOSIS — Z720 Tobacco use: Secondary | ICD-10-CM | POA: Diagnosis not present

## 2015-03-13 DIAGNOSIS — I10 Essential (primary) hypertension: Secondary | ICD-10-CM | POA: Insufficient documentation

## 2015-03-13 DIAGNOSIS — E119 Type 2 diabetes mellitus without complications: Secondary | ICD-10-CM | POA: Diagnosis not present

## 2015-03-13 DIAGNOSIS — H269 Unspecified cataract: Secondary | ICD-10-CM | POA: Diagnosis not present

## 2015-03-13 HISTORY — DX: Alcohol abuse, uncomplicated: F10.10

## 2015-03-13 NOTE — ED Notes (Addendum)
Pt states he fell 2 weeks ago-c/o pain "to my ass"- right buttock-pt with unsteady gait-slurred speech-smells of ETOH

## 2015-03-13 NOTE — ED Notes (Addendum)
Per ems pt states he fell x 3 weeks ago and is having right buttock pain, admits etoh tonight and that homeless shelter would not allow him to stay, bp 140/70 p 85 rr 18 1010 pain,

## 2016-04-08 DIAGNOSIS — Z5900 Homelessness unspecified: Secondary | ICD-10-CM

## 2016-04-13 NOTE — Congregational Nurse Program (Signed)
Congregational Nurse Program Note  Date of Encounter: 04/08/2016  Past Medical History: Past Medical History:  Diagnosis Date  . Anxiety   . Asthma   . Cataract    bilateral  . Decreased visual acuity    can only see well with right eye  . Depression   . Diabetes    dka 09/2012   . Dysrhythmia   . ETOH abuse   . Glaucoma   . Headache(784.0)   . Hypertension   . Schizophrenia, paranoid type    [facility]  . Tobacco abuse     Encounter Details:     CNP Questionnaire - 04/13/16 0854      Patient Demographics   Is this a new or existing patient? New   Patient is considered a/an Not Applicable   Race African-American/Black     Patient Assistance   Location of Patient Assistance Not Applicable   Patient's financial/insurance status Low Income;Medicaid   Uninsured Patient No   Patient referred to apply for the following financial assistance Not Applicable   Food insecurities addressed Not Applicable   Transportation assistance Yes   Type of Assistance Bus Pass Given   Assistance securing medications No   Educational health offerings Behavioral health     Encounter Details   Primary purpose of visit Chronic Illness/Condition Visit;Navigating the Healthcare System   Was an Emergency Department visit averted? Not Applicable   Does patient have a medical provider? Yes   Patient referred to Not Applicable   Was a mental health screening completed? (GAINS tool) No   Does patient have dental issues? No   Does patient have vision issues? No   Does your patient have an abnormal blood pressure today? No   Since previous encounter, have you referred patient for abnormal blood pressure that resulted in a new diagnosis or medication change? No   Does your patient have an abnormal blood glucose today? No   Since previous encounter, have you referred patient for abnormal blood glucose that resulted in a new diagnosis or medication change? No   Was there a life-saving  intervention made? No     Requested bus passes to go to [facility] his medications.  Buss passes provided

## 2016-04-22 ENCOUNTER — Ambulatory Visit (HOSPITAL_COMMUNITY)
Admission: EM | Admit: 2016-04-22 | Discharge: 2016-04-22 | Disposition: A | Discharge status: Home / Self Care | Payer: Medicaid Other | Attending: Family Medicine | Admitting: Family Medicine

## 2016-04-22 ENCOUNTER — Encounter (HOSPITAL_COMMUNITY): Payer: Self-pay | Admitting: Emergency Medicine

## 2016-04-22 DIAGNOSIS — Z79899 Other long term (current) drug therapy: Secondary | ICD-10-CM | POA: Insufficient documentation

## 2016-04-22 DIAGNOSIS — F17210 Nicotine dependence, cigarettes, uncomplicated: Secondary | ICD-10-CM | POA: Insufficient documentation

## 2016-04-22 DIAGNOSIS — R21 Rash and other nonspecific skin eruption: Secondary | ICD-10-CM | POA: Insufficient documentation

## 2016-04-22 DIAGNOSIS — R369 Urethral discharge, unspecified: Secondary | ICD-10-CM

## 2016-04-22 LAB — POCT URINALYSIS DIP (DEVICE)
GLUCOSE, UA: NEGATIVE mg/dL
Ketones, ur: NEGATIVE mg/dL
NITRITE: NEGATIVE
Protein, ur: NEGATIVE mg/dL
Specific Gravity, Urine: 1.02 (ref 1.005–1.030)
Urobilinogen, UA: 2 mg/dL — ABNORMAL HIGH (ref 0.0–1.0)
pH: 6 (ref 5.0–8.0)

## 2016-04-22 MED ORDER — CEFTRIAXONE SODIUM 250 MG IJ SOLR
INTRAMUSCULAR | Status: AC
  Filled 2016-04-22: qty 250

## 2016-04-22 MED ORDER — LIDOCAINE HCL (PF) 1 % IJ SOLN
INTRAMUSCULAR | Status: AC
  Filled 2016-04-22: qty 2

## 2016-04-22 MED ORDER — AZITHROMYCIN 250 MG PO TABS
ORAL_TABLET | ORAL | Status: AC
  Filled 2016-04-22: qty 4

## 2016-04-22 MED ORDER — AZITHROMYCIN 250 MG PO TABS
1000.0000 mg | ORAL_TABLET | Freq: Once | ORAL | Status: AC
  Administered 2016-04-22: 1000 mg via ORAL

## 2016-04-22 MED ORDER — CEFTRIAXONE SODIUM 250 MG IJ SOLR
250.0000 mg | Freq: Once | INTRAMUSCULAR | Status: AC
  Administered 2016-04-22: 250 mg via INTRAMUSCULAR

## 2016-04-22 NOTE — ED Provider Notes (Signed)
CSN: [id number]     Arrival date & time 04/22/16  1051 History   First MD Initiated Contact with Patient 04/22/16 1234     Chief Complaint  Patient presents with  . Penile Discharge  . Rash   (Consider location/radiation/quality/duration/timing/severity/associated sxs/prior Treatment) Patient is a well-appearing 60 y.o male, presents today for penile discharge and dysuria. He is sexually active with 2 partner, last sexual encounter was 3-4 days ago and was unprotected. He reports penile discharge to be yellow and yucky looking. He denies penile pain or rash. He also denies testicular pain/rash or swelling. He also denies CP, SOB, Abd pain, N/V, headache, dizziness.       Past Medical History:  Diagnosis Date  . Anxiety   . Asthma   . Cataract    bilateral  . Decreased visual acuity    can only see well with right eye  . Depression   . Diabetes (HCC)    dka 09/2012   . Dysrhythmia   . ETOH abuse   . Glaucoma   . Headache(784.0)   . Hypertension   . Schizophrenia, paranoid type (HCC)    [facility]  . Tobacco abuse    Past Surgical History:  Procedure Laterality Date  . KNEE CARTILAGE SURGERY Right   . R rear finger    . TOTAL HIP ARTHROPLASTY Bilateral 2004, 2006   2/2 injury from fall   Family History  Problem Relation Age of Onset  . Coronary artery disease Sister    Social History  Substance Use Topics  . Smoking status: Current Every Day Smoker    Packs/day: 0.50    Years: 40.00    Types: Cigarettes  . Smokeless tobacco: Former NeurosurgeonUser  . Alcohol use Yes    Review of Systems  All other systems reviewed and are negative.   Allergies  Review of patient's allergies indicates no known allergies.  Home Medications   Prior to Admission medications   Medication Sig Start Date End Date Taking? Authorizing Provider  QUEtiapine (SEROQUEL) 200 MG tablet Take 400 mg by mouth at bedtime.   Yes Historical Provider, MD  atenolol (TENORMIN) 25 MG tablet Take  25 mg by mouth 2 (two) times daily.    Historical Provider, MD  diclofenac (VOLTAREN) 75 MG EC tablet Take 75 mg by mouth 2 (two) times daily.    Historical Provider, MD  divalproex (DEPAKOTE ER) 500 MG 24 hr tablet Take 2,000 mg by mouth 2 (two) times daily.     Historical Provider, MD  doxycycline (VIBRA-TABS) 100 MG tablet Take 100 mg by mouth 2 (two) times daily.    Historical Provider, MD  FLUoxetine (PROZAC) 20 MG capsule Take 40 mg by mouth daily.     Historical Provider, MD  methocarbamol (ROBAXIN) 750 MG tablet Take 750 mg by mouth 2 (two) times daily.    Historical Provider, MD  ondansetron (ZOFRAN) 4 MG tablet Take 1 tablet (4 mg total) by mouth every 8 (eight) hours as needed for nausea or vomiting. 11/21/13   Arthor CaptainAbigail Harris, PA-C  triamcinolone cream (KENALOG) 0.5 % Apply 1 application topically 2 (two) times daily.    Historical Provider, MD   Meds Ordered and Administered this Visit   Medications  cefTRIAXone (ROCEPHIN) injection 250 mg (not administered)  azithromycin (ZITHROMAX) tablet 1,000 mg (not administered)    BP 121/80 (BP Location: Left Arm)   Pulse 68   Temp 98.4 F (36.9 C) (Oral)   Resp 18  SpO2 100%  No data found.   Physical Exam  Constitutional: He is oriented to person, place, and time. He appears well-developed and well-nourished.  HENT:  Head: Normocephalic and atraumatic.  Cardiovascular: Normal rate, regular rhythm and normal heart sounds.   Pulmonary/Chest: Effort normal and breath sounds normal.  Genitourinary:  Genitourinary Comments: Yellow thin penile discharge present. No penile rash, no testicular swelling/pain/rash  Neurological: He is alert and oriented to person, place, and time.  Nursing note and vitals reviewed.   Urgent Care Course   Clinical Course    Procedures (including critical care time)  Labs Review Labs Reviewed  POCT URINALYSIS DIP (DEVICE) - Abnormal; Notable for the following:       Result Value   Bilirubin  Urine SMALL (*)    Hgb urine dipstick TRACE (*)    Urobilinogen, UA 2.0 (*)    Leukocytes, UA SMALL (*)    All other components within normal limits  URINE CULTURE  HIV ANTIBODY (ROUTINE TESTING)  URINE CYTOLOGY ANCILLARY ONLY    Imaging Review No results found.       MDM   1. Penile discharge    Don't suspect UTI;  urine send off today for an urine culture and is pending. Patient treated presumptively for STD today with rocephin and azithromycin. HIV testing also obtained per request. Patient informed that we will only call him if the results are positive. Informed to call us in 3-4 days for results. Informed to notify his sexual partners if the testing are positive.    Lucia Estelle, NP 04/22/16 [phone]

## 2016-04-22 NOTE — ED Triage Notes (Signed)
[facility] a complaint of penile discharge and dysuria x 3 days.  The patient also had a complaint of a rash on his torso x 2 weeks that he stated has gotten worse and painful.

## 2016-04-22 NOTE — Discharge Instructions (Signed)
We send your urine off today for an urine culture, and this will truly tell us if you have a UTI.  We also send your urine off for STD testing. However we went ahead and treated you today for Gonorrhea and Chlamydia.   Call us in 3-4 days for your results. We will only call you if the result are positive.

## 2016-04-22 NOTE — ED Notes (Signed)
Patient became very irate with lab staff during blood draw. I spoke with the patient and offered to draw the blood but he refused. He stated that he was just going to leave. Order was cancelled.

## 2016-04-23 LAB — URINE CULTURE

## 2016-04-23 LAB — URINE CYTOLOGY ANCILLARY ONLY
Chlamydia: NEGATIVE
Neisseria Gonorrhea: POSITIVE — AB

## 2016-04-27 ENCOUNTER — Telehealth (HOSPITAL_COMMUNITY): Payer: Self-pay | Admitting: Emergency Medicine

## 2016-04-27 NOTE — Telephone Encounter (Signed)
Called pt and notified of recent lab results Pt ID'd properly... Reports feeling better and sx have subsided Adv pt if sx are not getting better to return or to f/u w/PCP Education on safe sex given Also adv pt to notify partner(s) Faxed documentation to [facility] Pt verb understanding.      

## 2016-04-27 NOTE — Telephone Encounter (Signed)
-----   Message from Eustace MooreLaura W Murray, MD sent at 04/24/2016  5:42 PM EDT ----- Please let patient and health department know that test for gonorrhea was positive.  [facility] with IM rocephin 250mg  and po zithromax 1g.   Sexual partners need to be notified and tested/treated.  Recheck for further evaluation if symptoms persist.   Urine cx does not clearly demonstrate a UTI.  LM

## 2017-01-16 ENCOUNTER — Encounter (HOSPITAL_COMMUNITY): Payer: Self-pay

## 2017-01-16 DIAGNOSIS — Y929 Unspecified place or not applicable: Secondary | ICD-10-CM | POA: Insufficient documentation

## 2017-01-16 DIAGNOSIS — S0990XA Unspecified injury of head, initial encounter: Secondary | ICD-10-CM | POA: Insufficient documentation

## 2017-01-16 DIAGNOSIS — R1110 Vomiting, unspecified: Secondary | ICD-10-CM | POA: Insufficient documentation

## 2017-01-16 DIAGNOSIS — J45909 Unspecified asthma, uncomplicated: Secondary | ICD-10-CM | POA: Insufficient documentation

## 2017-01-16 DIAGNOSIS — I10 Essential (primary) hypertension: Secondary | ICD-10-CM | POA: Insufficient documentation

## 2017-01-16 DIAGNOSIS — Z23 Encounter for immunization: Secondary | ICD-10-CM | POA: Insufficient documentation

## 2017-01-16 DIAGNOSIS — Y999 Unspecified external cause status: Secondary | ICD-10-CM | POA: Diagnosis not present

## 2017-01-16 DIAGNOSIS — E119 Type 2 diabetes mellitus without complications: Secondary | ICD-10-CM | POA: Diagnosis not present

## 2017-01-16 DIAGNOSIS — Y9389 Activity, other specified: Secondary | ICD-10-CM | POA: Insufficient documentation

## 2017-01-16 DIAGNOSIS — M25532 Pain in left wrist: Secondary | ICD-10-CM | POA: Insufficient documentation

## 2017-01-16 DIAGNOSIS — S00531A Contusion of lip, initial encounter: Secondary | ICD-10-CM | POA: Insufficient documentation

## 2017-01-16 DIAGNOSIS — Z96643 Presence of artificial hip joint, bilateral: Secondary | ICD-10-CM | POA: Diagnosis not present

## 2017-01-16 DIAGNOSIS — F17210 Nicotine dependence, cigarettes, uncomplicated: Secondary | ICD-10-CM | POA: Diagnosis not present

## 2017-01-16 DIAGNOSIS — M791 Myalgia: Secondary | ICD-10-CM | POA: Diagnosis not present
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
## 2017-01-17 ENCOUNTER — Emergency Department (HOSPITAL_COMMUNITY): Payer: Medicaid Other

## 2017-01-17 ENCOUNTER — Emergency Department (HOSPITAL_COMMUNITY)
Admission: EM | Admit: 2017-01-17 | Discharge: 2017-01-17 | Disposition: A | Discharge status: Home / Self Care | Payer: Medicaid Other | Attending: Emergency Medicine | Admitting: Emergency Medicine

## 2017-01-17 DIAGNOSIS — S0990XA Unspecified injury of head, initial encounter: Secondary | ICD-10-CM

## 2017-01-17 DIAGNOSIS — S00531A Contusion of lip, initial encounter: Secondary | ICD-10-CM

## 2017-01-17 MED ORDER — TETANUS-DIPHTH-ACELL PERTUSSIS 5-2.5-18.5 LF-MCG/0.5 IM SUSP
0.5000 mL | Freq: Once | INTRAMUSCULAR | Status: AC
  Administered 2017-01-17: 0.5 mL via INTRAMUSCULAR
  Filled 2017-01-17: qty 0.5

## 2017-01-17 MED ORDER — HYDROCODONE-ACETAMINOPHEN 5-325 MG PO TABS
1.0000 | ORAL_TABLET | Freq: Once | ORAL | Status: AC
  Administered 2017-01-17: 1 via ORAL
  Filled 2017-01-17: qty 1

## 2017-01-17 NOTE — ED Provider Notes (Signed)
Initial temp at 100.1 but pt without any infectious symptoms D/c home   [name], Altha Sweitzer, MD 01/17/17 [phone]

## 2017-01-17 NOTE — ED Notes (Signed)
Pt refused visual acuity screening, stating, "I don't need it; my head and mouth is what is bothering me."

## 2017-01-17 NOTE — ED Notes (Signed)
Pt asleep in lobby when called to recheck vitals and did not want to get up.
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
## 2019-09-28 ENCOUNTER — Emergency Department (HOSPITAL_COMMUNITY): Payer: Medicaid Other

## 2019-09-28 ENCOUNTER — Encounter (HOSPITAL_COMMUNITY): Admission: EM | Disposition: A | Discharge status: Home / Self Care | Payer: Self-pay | Source: Home / Self Care

## 2019-09-28 ENCOUNTER — Emergency Department (HOSPITAL_COMMUNITY): Payer: Medicaid Other | Admitting: Registered Nurse

## 2019-09-28 ENCOUNTER — Inpatient Hospital Stay (HOSPITAL_COMMUNITY): Payer: Medicaid Other | Admitting: Anesthesiology

## 2019-09-28 ENCOUNTER — Other Ambulatory Visit: Payer: Self-pay

## 2019-09-28 ENCOUNTER — Inpatient Hospital Stay (HOSPITAL_COMMUNITY): Payer: Medicaid Other

## 2019-09-28 ENCOUNTER — Encounter (HOSPITAL_COMMUNITY): Payer: Self-pay | Admitting: *Deleted

## 2019-09-28 ENCOUNTER — Inpatient Hospital Stay (HOSPITAL_COMMUNITY)
Admission: EM | Admit: 2019-09-28 | Discharge: 2019-10-13 | DRG: 907 | Disposition: A | Discharge status: Home / Self Care | Payer: Medicaid Other | Attending: Orthopaedic Surgery | Admitting: Orthopaedic Surgery

## 2019-09-28 DIAGNOSIS — S85012A Laceration of popliteal artery, left leg, initial encounter: Secondary | ICD-10-CM | POA: Diagnosis present

## 2019-09-28 DIAGNOSIS — I10 Essential (primary) hypertension: Secondary | ICD-10-CM | POA: Diagnosis present

## 2019-09-28 DIAGNOSIS — I724 Aneurysm of artery of lower extremity: Secondary | ICD-10-CM | POA: Diagnosis present

## 2019-09-28 DIAGNOSIS — M6282 Rhabdomyolysis: Secondary | ICD-10-CM | POA: Diagnosis not present

## 2019-09-28 DIAGNOSIS — F17200 Nicotine dependence, unspecified, uncomplicated: Secondary | ICD-10-CM | POA: Diagnosis present

## 2019-09-28 DIAGNOSIS — S72452B Displaced supracondylar fracture without intracondylar extension of lower end of left femur, initial encounter for open fracture type I or II: Secondary | ICD-10-CM

## 2019-09-28 DIAGNOSIS — Y92830 Public park as the place of occurrence of the external cause: Secondary | ICD-10-CM

## 2019-09-28 DIAGNOSIS — M79675 Pain in left toe(s): Secondary | ICD-10-CM

## 2019-09-28 DIAGNOSIS — S72352A Displaced comminuted fracture of shaft of left femur, initial encounter for closed fracture: Secondary | ICD-10-CM | POA: Diagnosis not present

## 2019-09-28 DIAGNOSIS — B1920 Unspecified viral hepatitis C without hepatic coma: Secondary | ICD-10-CM | POA: Diagnosis present

## 2019-09-28 DIAGNOSIS — Z9889 Other specified postprocedural states: Secondary | ICD-10-CM

## 2019-09-28 DIAGNOSIS — W1839XA Other fall on same level, initial encounter: Secondary | ICD-10-CM | POA: Diagnosis present

## 2019-09-28 DIAGNOSIS — E119 Type 2 diabetes mellitus without complications: Secondary | ICD-10-CM | POA: Diagnosis present

## 2019-09-28 DIAGNOSIS — Z419 Encounter for procedure for purposes other than remedying health state, unspecified: Secondary | ICD-10-CM

## 2019-09-28 DIAGNOSIS — M25572 Pain in left ankle and joints of left foot: Secondary | ICD-10-CM | POA: Diagnosis not present

## 2019-09-28 DIAGNOSIS — Z79899 Other long term (current) drug therapy: Secondary | ICD-10-CM

## 2019-09-28 DIAGNOSIS — E872 Acidosis: Secondary | ICD-10-CM | POA: Diagnosis present

## 2019-09-28 DIAGNOSIS — M79652 Pain in left thigh: Secondary | ICD-10-CM | POA: Diagnosis present

## 2019-09-28 DIAGNOSIS — Z20822 Contact with and (suspected) exposure to covid-19: Secondary | ICD-10-CM | POA: Diagnosis present

## 2019-09-28 DIAGNOSIS — F209 Schizophrenia, unspecified: Secondary | ICD-10-CM | POA: Diagnosis present

## 2019-09-28 DIAGNOSIS — D62 Acute posthemorrhagic anemia: Secondary | ICD-10-CM | POA: Diagnosis present

## 2019-09-28 DIAGNOSIS — M109 Gout, unspecified: Secondary | ICD-10-CM | POA: Diagnosis present

## 2019-09-28 DIAGNOSIS — S00211A Abrasion of right eyelid and periocular area, initial encounter: Secondary | ICD-10-CM | POA: Diagnosis present

## 2019-09-28 DIAGNOSIS — F10139 Alcohol abuse with withdrawal, unspecified: Secondary | ICD-10-CM | POA: Diagnosis not present

## 2019-09-28 DIAGNOSIS — W3400XA Accidental discharge from unspecified firearms or gun, initial encounter: Secondary | ICD-10-CM

## 2019-09-28 DIAGNOSIS — D696 Thrombocytopenia, unspecified: Secondary | ICD-10-CM | POA: Diagnosis not present

## 2019-09-28 DIAGNOSIS — F10129 Alcohol abuse with intoxication, unspecified: Secondary | ICD-10-CM | POA: Diagnosis present

## 2019-09-28 DIAGNOSIS — R578 Other shock: Secondary | ICD-10-CM | POA: Diagnosis present

## 2019-09-28 DIAGNOSIS — J9601 Acute respiratory failure with hypoxia: Secondary | ICD-10-CM | POA: Diagnosis not present

## 2019-09-28 DIAGNOSIS — J9690 Respiratory failure, unspecified, unspecified whether with hypoxia or hypercapnia: Secondary | ICD-10-CM

## 2019-09-28 DIAGNOSIS — I472 Ventricular tachycardia: Secondary | ICD-10-CM | POA: Diagnosis not present

## 2019-09-28 DIAGNOSIS — S72452C Displaced supracondylar fracture without intracondylar extension of lower end of left femur, initial encounter for open fracture type IIIA, IIIB, or IIIC: Secondary | ICD-10-CM | POA: Diagnosis present

## 2019-09-28 DIAGNOSIS — S75092A Other specified injury of femoral artery, left leg, initial encounter: Secondary | ICD-10-CM

## 2019-09-28 DIAGNOSIS — Z96642 Presence of left artificial hip joint: Secondary | ICD-10-CM | POA: Diagnosis present

## 2019-09-28 DIAGNOSIS — Z978 Presence of other specified devices: Secondary | ICD-10-CM

## 2019-09-28 DIAGNOSIS — Z09 Encounter for follow-up examination after completed treatment for conditions other than malignant neoplasm: Secondary | ICD-10-CM

## 2019-09-28 DIAGNOSIS — S75022A Major laceration of femoral artery, left leg, initial encounter: Principal | ICD-10-CM | POA: Diagnosis present

## 2019-09-28 HISTORY — PX: INSERTION OF TRACTION PIN: SHX6560

## 2019-09-28 HISTORY — PX: ORIF FEMUR FRACTURE: SHX2119

## 2019-09-28 HISTORY — PX: FEMORAL-POPLITEAL BYPASS GRAFT: SHX937

## 2019-09-28 HISTORY — PX: VEIN HARVEST: SHX6363

## 2019-09-28 HISTORY — PX: FASCIOTOMY: SHX132

## 2019-09-28 HISTORY — DX: Unspecified viral hepatitis C without hepatic coma: B19.20

## 2019-09-28 LAB — POCT I-STAT 7, (LYTES, BLD GAS, ICA,H+H)
Acid-Base Excess: 4 mmol/L — ABNORMAL HIGH (ref 0.0–2.0)
Acid-base deficit: 11 mmol/L — ABNORMAL HIGH (ref 0.0–2.0)
Bicarbonate: 17.8 mmol/L — ABNORMAL LOW (ref 20.0–28.0)
Bicarbonate: 24.9 mmol/L (ref 20.0–28.0)
Bicarbonate: 28.2 mmol/L — ABNORMAL HIGH (ref 20.0–28.0)
Calcium, Ion: 1.06 mmol/L — ABNORMAL LOW (ref 1.15–1.40)
Calcium, Ion: 1.08 mmol/L — ABNORMAL LOW (ref 1.15–1.40)
Calcium, Ion: 1.08 mmol/L — ABNORMAL LOW (ref 1.15–1.40)
HCT: 26 % — ABNORMAL LOW (ref 39.0–52.0)
HCT: 21 % — ABNORMAL LOW (ref 39.0–52.0)
HCT: 22 % — ABNORMAL LOW (ref 39.0–52.0)
Hemoglobin: 8.8 g/dL — ABNORMAL LOW (ref 13.0–17.0)
Hemoglobin: 7.1 g/dL — ABNORMAL LOW (ref 13.0–17.0)
Hemoglobin: 7.5 g/dL — ABNORMAL LOW (ref 13.0–17.0)
O2 Saturation: 100 %
O2 Saturation: 100 %
O2 Saturation: 100 %
Patient temperature: 34.8
Patient temperature: 34.9
Patient temperature: 35.3
Potassium: 4.6 mmol/L (ref 3.5–5.1)
Potassium: 4.3 mmol/L (ref 3.5–5.1)
Potassium: 3.6 mmol/L (ref 3.5–5.1)
Sodium: 141 mmol/L (ref 135–145)
Sodium: 146 mmol/L — ABNORMAL HIGH (ref 135–145)
Sodium: 144 mmol/L (ref 135–145)
TCO2: 19 mmol/L — ABNORMAL LOW (ref 22–32)
TCO2: 26 mmol/L (ref 22–32)
TCO2: 29 mmol/L (ref 22–32)
pCO2 arterial: 50.9 mmHg — ABNORMAL HIGH (ref 32.0–48.0)
pCO2 arterial: 38.5 mmHg (ref 32.0–48.0)
pCO2 arterial: 35.2 mmHg (ref 32.0–48.0)
pH, Arterial: 7.139 — CL (ref 7.350–7.450)
pH, Arterial: 7.409 (ref 7.350–7.450)
pH, Arterial: 7.505 — ABNORMAL HIGH (ref 7.350–7.450)
pO2, Arterial: 335 mmHg — ABNORMAL HIGH (ref 83.0–108.0)
pO2, Arterial: 333 mmHg — ABNORMAL HIGH (ref 83.0–108.0)
pO2, Arterial: 313 mmHg — ABNORMAL HIGH (ref 83.0–108.0)

## 2019-09-28 LAB — SAMPLE TO BLOOD BANK

## 2019-09-28 LAB — GLUCOSE, CAPILLARY
Glucose-Capillary: 194 mg/dL — ABNORMAL HIGH (ref 70–99)
Glucose-Capillary: 158 mg/dL — ABNORMAL HIGH (ref 70–99)
Glucose-Capillary: 130 mg/dL — ABNORMAL HIGH (ref 70–99)
Glucose-Capillary: 96 mg/dL (ref 70–99)

## 2019-09-28 LAB — CBC
HCT: 31.4 % — ABNORMAL LOW (ref 39.0–52.0)
HCT: 35.2 % — ABNORMAL LOW (ref 39.0–52.0)
Hemoglobin: 11.1 g/dL — ABNORMAL LOW (ref 13.0–17.0)
Hemoglobin: 11.6 g/dL — ABNORMAL LOW (ref 13.0–17.0)
MCH: 31.7 pg (ref 26.0–34.0)
MCH: 32.7 pg (ref 26.0–34.0)
MCHC: 33 g/dL (ref 30.0–36.0)
MCHC: 35.4 g/dL (ref 30.0–36.0)
MCV: 89.7 fL (ref 80.0–100.0)
MCV: 99.2 fL (ref 80.0–100.0)
Platelets: 204 10*3/uL (ref 150–400)
Platelets: 68 10*3/uL — ABNORMAL LOW (ref 150–400)
RBC: 3.5 MIL/uL — ABNORMAL LOW (ref 4.22–5.81)
RBC: 3.55 MIL/uL — ABNORMAL LOW (ref 4.22–5.81)
RDW: 12.4 % (ref 11.5–15.5)
RDW: 14.1 % (ref 11.5–15.5)
WBC: 5.6 10*3/uL (ref 4.0–10.5)
WBC: 5.7 10*3/uL (ref 4.0–10.5)
nRBC: 0 % (ref 0.0–0.2)
nRBC: 0 % (ref 0.0–0.2)

## 2019-09-28 LAB — POCT I-STAT, CHEM 8
BUN: 12 mg/dL (ref 8–23)
BUN: 8 mg/dL (ref 8–23)
Calcium, Ion: 1.68 mmol/L (ref 1.15–1.40)
Calcium, Ion: 1 mmol/L — ABNORMAL LOW (ref 1.15–1.40)
Chloride: 109 mmol/L (ref 98–111)
Chloride: 108 mmol/L (ref 98–111)
Creatinine, Ser: 1.3 mg/dL — ABNORMAL HIGH (ref 0.61–1.24)
Creatinine, Ser: 0.8 mg/dL (ref 0.61–1.24)
Glucose, Bld: 286 mg/dL — ABNORMAL HIGH (ref 70–99)
Glucose, Bld: 176 mg/dL — ABNORMAL HIGH (ref 70–99)
HCT: 16 % — ABNORMAL LOW (ref 39.0–52.0)
HCT: 18 % — ABNORMAL LOW (ref 39.0–52.0)
Hemoglobin: 5.4 g/dL — CL (ref 13.0–17.0)
Hemoglobin: 6.1 g/dL — CL (ref 13.0–17.0)
Potassium: 5.1 mmol/L (ref 3.5–5.1)
Potassium: 3.7 mmol/L (ref 3.5–5.1)
Sodium: 136 mmol/L (ref 135–145)
Sodium: 146 mmol/L — ABNORMAL HIGH (ref 135–145)
TCO2: 18 mmol/L — ABNORMAL LOW (ref 22–32)
TCO2: 22 mmol/L (ref 22–32)

## 2019-09-28 LAB — COMPREHENSIVE METABOLIC PANEL
ALT: 41 U/L (ref 0–44)
AST: 58 U/L — ABNORMAL HIGH (ref 15–41)
Albumin: 3.4 g/dL — ABNORMAL LOW (ref 3.5–5.0)
Alkaline Phosphatase: 53 U/L (ref 38–126)
Anion gap: 16 — ABNORMAL HIGH (ref 5–15)
BUN: 12 mg/dL (ref 8–23)
CO2: 14 mmol/L — ABNORMAL LOW (ref 22–32)
Calcium: 8.5 mg/dL — ABNORMAL LOW (ref 8.9–10.3)
Chloride: 108 mmol/L (ref 98–111)
Creatinine, Ser: 1.42 mg/dL — ABNORMAL HIGH (ref 0.61–1.24)
GFR calc Af Amer: >60 mL/min (ref >60)
GFR calc non Af Amer: 53 mL/min — ABNORMAL LOW (ref >60)
Glucose, Bld: 283 mg/dL — ABNORMAL HIGH (ref 70–99)
Potassium: 3 mmol/L — ABNORMAL LOW (ref 3.5–5.1)
Sodium: 138 mmol/L (ref 135–145)
Total Bilirubin: 0.5 mg/dL (ref 0.3–1.2)
Total Protein: 6.1 g/dL — ABNORMAL LOW (ref 6.5–8.1)

## 2019-09-28 LAB — ETHANOL: Alcohol, Ethyl (B): 117 mg/dL — ABNORMAL HIGH (ref <10)

## 2019-09-28 LAB — PREPARE RBC (CROSSMATCH)

## 2019-09-28 LAB — PROTIME-INR
INR: 1.1 (ref 0.8–1.2)
Prothrombin Time: 14.2 seconds (ref 11.4–15.2)

## 2019-09-28 LAB — RESPIRATORY PANEL BY RT PCR (FLU A&B, COVID)
Influenza A by PCR: NEGATIVE
Influenza B by PCR: NEGATIVE
SARS Coronavirus 2 by RT PCR: NEGATIVE

## 2019-09-28 LAB — I-STAT CHEM 8, ED
BUN: 11 mg/dL (ref 8–23)
Calcium, Ion: 1.07 mmol/L — ABNORMAL LOW (ref 1.15–1.40)
Chloride: 105 mmol/L (ref 98–111)
Creatinine, Ser: 1.5 mg/dL — ABNORMAL HIGH (ref 0.61–1.24)
Glucose, Bld: 270 mg/dL — ABNORMAL HIGH (ref 70–99)
HCT: 34 % — ABNORMAL LOW (ref 39.0–52.0)
Hemoglobin: 11.6 g/dL — ABNORMAL LOW (ref 13.0–17.0)
Potassium: 2.8 mmol/L — ABNORMAL LOW (ref 3.5–5.1)
Sodium: 140 mmol/L (ref 135–145)
TCO2: 17 mmol/L — ABNORMAL LOW (ref 22–32)

## 2019-09-28 LAB — LACTIC ACID, PLASMA
Lactic Acid, Venous: 6.8 mmol/L (ref 0.5–1.9)
Lactic Acid, Venous: 4.4 mmol/L (ref 0.5–1.9)
Lactic Acid, Venous: 3.6 mmol/L (ref 0.5–1.9)

## 2019-09-28 LAB — HIV ANTIBODY (ROUTINE TESTING W REFLEX): HIV Screen 4th Generation wRfx: NONREACTIVE

## 2019-09-28 LAB — MRSA PCR SCREENING: MRSA by PCR: NEGATIVE

## 2019-09-28 SURGERY — OPEN REDUCTION INTERNAL FIXATION (ORIF) DISTAL FEMUR FRACTURE
Anesthesia: General | Site: Leg Upper | Laterality: Left

## 2019-09-28 SURGERY — BYPASS GRAFT FEMORAL-POPLITEAL ARTERY
Anesthesia: General | Laterality: Right

## 2019-09-28 MED ORDER — CHLORHEXIDINE GLUCONATE CLOTH 2 % EX PADS
6.0000 | MEDICATED_PAD | Freq: Every day | CUTANEOUS | Status: DC
  Administered 2019-09-28 – 2019-10-12 (×10): 6 via TOPICAL

## 2019-09-28 MED ORDER — VANCOMYCIN HCL 1000 MG IV SOLR
INTRAVENOUS | Status: AC
  Filled 2019-09-28: qty 1000

## 2019-09-28 MED ORDER — VASOPRESSIN 20 UNIT/ML IV SOLN
INTRAVENOUS | Status: AC
  Filled 2019-09-28: qty 1

## 2019-09-28 MED ORDER — FENTANYL 2500MCG IN NS 250ML (10MCG/ML) PREMIX INFUSION
50.0000 ug/h | INTRAVENOUS | Status: DC
  Administered 2019-09-28: 150 ug/h via INTRAVENOUS
  Administered 2019-09-28: 200 ug/h via INTRAVENOUS
  Filled 2019-09-28 (×2): qty 250

## 2019-09-28 MED ORDER — PROPOFOL 500 MG/50ML IV EMUL
INTRAVENOUS | Status: DC | PRN
  Administered 2019-09-28: 50 ug/kg/min via INTRAVENOUS

## 2019-09-28 MED ORDER — FENTANYL CITRATE (PF) 250 MCG/5ML IJ SOLN
INTRAMUSCULAR | Status: DC | PRN
  Administered 2019-09-28: 100 ug via INTRAVENOUS
  Administered 2019-09-28 (×8): 50 ug via INTRAVENOUS

## 2019-09-28 MED ORDER — VASOPRESSIN 20 UNIT/ML IV SOLN
INTRAVENOUS | Status: DC | PRN
  Administered 2019-09-28 (×5): 2 [IU] via INTRAVENOUS

## 2019-09-28 MED ORDER — ALBUMIN HUMAN 5 % IV SOLN: INTRAVENOUS | Status: DC | PRN

## 2019-09-28 MED ORDER — PHENYLEPHRINE HCL-NACL 10-0.9 MG/250ML-% IV SOLN
INTRAVENOUS | Status: DC | PRN
  Administered 2019-09-28: 25 ug/min via INTRAVENOUS

## 2019-09-28 MED ORDER — DEXAMETHASONE SODIUM PHOSPHATE 10 MG/ML IJ SOLN
INTRAMUSCULAR | Status: DC | PRN
  Administered 2019-09-28: 5 mg via INTRAVENOUS

## 2019-09-28 MED ORDER — PHENYLEPHRINE 40 MCG/ML (10ML) SYRINGE FOR IV PUSH (FOR BLOOD PRESSURE SUPPORT)
PREFILLED_SYRINGE | INTRAVENOUS | Status: AC
  Filled 2019-09-28: qty 10

## 2019-09-28 MED ORDER — BACITRACIN ZINC 500 UNIT/GM EX OINT
TOPICAL_OINTMENT | CUTANEOUS | Status: AC
  Filled 2019-09-28: qty 28.35

## 2019-09-28 MED ORDER — ROCURONIUM BROMIDE 10 MG/ML (PF) SYRINGE
PREFILLED_SYRINGE | INTRAVENOUS | Status: AC
  Filled 2019-09-28: qty 10

## 2019-09-28 MED ORDER — PHENYLEPHRINE 40 MCG/ML (10ML) SYRINGE FOR IV PUSH (FOR BLOOD PRESSURE SUPPORT)
PREFILLED_SYRINGE | INTRAVENOUS | Status: DC | PRN
  Administered 2019-09-28 (×3): 80 ug via INTRAVENOUS

## 2019-09-28 MED ORDER — PROPOFOL 1000 MG/100ML IV EMUL
0.0000 ug/kg/min | INTRAVENOUS | Status: DC
  Administered 2019-09-28 (×2): 50 ug/kg/min via INTRAVENOUS
  Administered 2019-09-29: 08:00:00 30 ug/kg/min via INTRAVENOUS
  Filled 2019-09-28 (×3): qty 100

## 2019-09-28 MED ORDER — POVIDONE-IODINE 10 % EX SWAB: 2.0000 "application " | Freq: Once | CUTANEOUS | Status: DC

## 2019-09-28 MED ORDER — SODIUM CHLORIDE 0.9 % IV SOLN
INTRAVENOUS | Status: DC | PRN
  Administered 2019-09-28: 500 mL

## 2019-09-28 MED ORDER — SODIUM CHLORIDE 0.9 % IV BOLUS
1000.0000 mL | Freq: Once | INTRAVENOUS | Status: AC
  Administered 2019-09-28: 02:00:00 1000 mL via INTRAVENOUS

## 2019-09-28 MED ORDER — SODIUM BICARBONATE 8.4 % IV SOLN
INTRAVENOUS | Status: DC | PRN
  Administered 2019-09-28: 150 meq via INTRAVENOUS
  Administered 2019-09-28: 50 meq via INTRAVENOUS

## 2019-09-28 MED ORDER — FENTANYL CITRATE (PF) 100 MCG/2ML IJ SOLN
50.0000 ug | Freq: Once | INTRAMUSCULAR | Status: DC
  Filled 2019-09-28: qty 2

## 2019-09-28 MED ORDER — FENTANYL CITRATE (PF) 250 MCG/5ML IJ SOLN
INTRAMUSCULAR | Status: AC
  Filled 2019-09-28: qty 5

## 2019-09-28 MED ORDER — PROPOFOL 1000 MG/100ML IV EMUL
INTRAVENOUS | Status: AC
  Filled 2019-09-28: qty 100

## 2019-09-28 MED ORDER — LACTATED RINGERS IV SOLN: INTRAVENOUS | Status: DC | PRN

## 2019-09-28 MED ORDER — CALCIUM CHLORIDE 10 % IV SOLN
INTRAVENOUS | Status: DC | PRN
  Administered 2019-09-28: 500 mg via INTRAVENOUS

## 2019-09-28 MED ORDER — ROCURONIUM BROMIDE 10 MG/ML (PF) SYRINGE
PREFILLED_SYRINGE | INTRAVENOUS | Status: DC | PRN
  Administered 2019-09-28: 50 mg via INTRAVENOUS
  Administered 2019-09-28: 30 mg via INTRAVENOUS
  Administered 2019-09-28: 20 mg via INTRAVENOUS

## 2019-09-28 MED ORDER — FENTANYL CITRATE (PF) 100 MCG/2ML IJ SOLN
100.0000 ug | Freq: Once | INTRAMUSCULAR | Status: AC
  Administered 2019-09-28: 100 ug via INTRAVENOUS

## 2019-09-28 MED ORDER — ROCURONIUM BROMIDE 10 MG/ML (PF) SYRINGE
PREFILLED_SYRINGE | INTRAVENOUS | Status: DC | PRN
  Administered 2019-09-28: 20 mg via INTRAVENOUS
  Administered 2019-09-28: 50 mg via INTRAVENOUS
  Administered 2019-09-28: 20 mg via INTRAVENOUS
  Administered 2019-09-28: 50 mg via INTRAVENOUS

## 2019-09-28 MED ORDER — ALBUMIN HUMAN 5 % IV SOLN
25.0000 g | Freq: Once | INTRAVENOUS | Status: AC
  Administered 2019-09-28: 25 g via INTRAVENOUS
  Filled 2019-09-28: qty 250

## 2019-09-28 MED ORDER — MIDAZOLAM HCL 2 MG/2ML IJ SOLN
INTRAMUSCULAR | Status: AC
  Filled 2019-09-28: qty 2

## 2019-09-28 MED ORDER — MIDAZOLAM HCL 5 MG/5ML IJ SOLN
INTRAMUSCULAR | Status: DC | PRN
  Administered 2019-09-28: 2 mg via INTRAVENOUS

## 2019-09-28 MED ORDER — PROPOFOL 10 MG/ML IV BOLUS
INTRAVENOUS | Status: AC
  Filled 2019-09-28: qty 20

## 2019-09-28 MED ORDER — PHENYLEPHRINE 40 MCG/ML (10ML) SYRINGE FOR IV PUSH (FOR BLOOD PRESSURE SUPPORT)
PREFILLED_SYRINGE | INTRAVENOUS | Status: DC | PRN
  Administered 2019-09-28: 80 ug via INTRAVENOUS
  Administered 2019-09-28: 120 ug via INTRAVENOUS
  Administered 2019-09-28: 80 ug via INTRAVENOUS

## 2019-09-28 MED ORDER — CEFAZOLIN SODIUM-DEXTROSE 2-4 GM/100ML-% IV SOLN
2.0000 g | INTRAVENOUS | Status: AC
  Administered 2019-09-28: 2 g via INTRAVENOUS

## 2019-09-28 MED ORDER — ORAL CARE MOUTH RINSE
15.0000 mL | OROMUCOSAL | Status: DC
  Administered 2019-09-28 – 2019-09-30 (×21): 15 mL via OROMUCOSAL

## 2019-09-28 MED ORDER — FENTANYL BOLUS VIA INFUSION
50.0000 ug | INTRAVENOUS | Status: DC | PRN
  Administered 2019-09-28 – 2019-09-29 (×2): 50 ug via INTRAVENOUS
  Filled 2019-09-28: qty 50

## 2019-09-28 MED ORDER — FENTANYL CITRATE (PF) 100 MCG/2ML IJ SOLN
INTRAMUSCULAR | Status: DC | PRN
  Administered 2019-09-28: 100 ug via INTRAVENOUS
  Administered 2019-09-28 (×3): 50 ug via INTRAVENOUS

## 2019-09-28 MED ORDER — ONDANSETRON HCL 4 MG/2ML IJ SOLN
INTRAMUSCULAR | Status: AC
  Administered 2019-09-28: 02:00:00 4 mg via INTRAVENOUS
  Filled 2019-09-28: qty 2

## 2019-09-28 MED ORDER — SODIUM CHLORIDE 0.9 % IV SOLN: 10.0000 mL/h | Freq: Once | INTRAVENOUS | Status: AC

## 2019-09-28 MED ORDER — SODIUM CHLORIDE 0.9% IV SOLUTION: Freq: Once | INTRAVENOUS | Status: DC

## 2019-09-28 MED ORDER — SODIUM CHLORIDE 0.9 % IV SOLN
INTRAVENOUS | Status: AC
  Filled 2019-09-28: qty 1.2

## 2019-09-28 MED ORDER — IOHEXOL 350 MG/ML SOLN
100.0000 mL | Freq: Once | INTRAVENOUS | Status: AC | PRN
  Administered 2019-09-28: 100 mL via INTRAVENOUS

## 2019-09-28 MED ORDER — PANTOPRAZOLE SODIUM 40 MG IV SOLR
40.0000 mg | INTRAVENOUS | Status: DC
  Administered 2019-09-28 – 2019-10-01 (×4): 40 mg via INTRAVENOUS
  Filled 2019-09-28 (×4): qty 40

## 2019-09-28 MED ORDER — 0.9 % SODIUM CHLORIDE (POUR BTL) OPTIME
TOPICAL | Status: DC | PRN
  Administered 2019-09-28: 2000 mL

## 2019-09-28 MED ORDER — ETOMIDATE 2 MG/ML IV SOLN
INTRAVENOUS | Status: DC | PRN
  Administered 2019-09-28: 14 mg via INTRAVENOUS

## 2019-09-28 MED ORDER — SUCCINYLCHOLINE CHLORIDE 200 MG/10ML IV SOSY
PREFILLED_SYRINGE | INTRAVENOUS | Status: DC | PRN
  Administered 2019-09-28: 120 mg via INTRAVENOUS

## 2019-09-28 MED ORDER — CHLORHEXIDINE GLUCONATE 4 % EX LIQD
60.0000 mL | Freq: Once | CUTANEOUS | Status: DC
  Filled 2019-09-28: qty 60

## 2019-09-28 MED ORDER — HEPARIN SODIUM (PORCINE) 1000 UNIT/ML IJ SOLN
INTRAMUSCULAR | Status: DC | PRN
  Administered 2019-09-28: 7000 [IU] via INTRAVENOUS

## 2019-09-28 MED ORDER — SODIUM CHLORIDE 0.9 % IV SOLN: INTRAVENOUS | Status: DC | PRN

## 2019-09-28 MED ORDER — METOPROLOL TARTRATE 5 MG/5ML IV SOLN
5.0000 mg | INTRAVENOUS | Status: DC | PRN
  Administered 2019-09-28 – 2019-09-30 (×2): 5 mg via INTRAVENOUS
  Filled 2019-09-28 (×2): qty 5

## 2019-09-28 MED ORDER — ONDANSETRON HCL 4 MG/2ML IJ SOLN: 4.0000 mg | Freq: Once | INTRAMUSCULAR | Status: AC

## 2019-09-28 MED ORDER — CEFAZOLIN SODIUM 1 G IJ SOLR
INTRAMUSCULAR | Status: AC
  Filled 2019-09-28: qty 20

## 2019-09-28 MED ORDER — VANCOMYCIN HCL 1000 MG IV SOLR
INTRAVENOUS | Status: DC | PRN
  Administered 2019-09-28: 2 g

## 2019-09-28 MED ORDER — DOCUSATE SODIUM 50 MG/5ML PO LIQD: 100.0000 mg | Freq: Two times a day (BID) | ORAL | Status: DC | PRN

## 2019-09-28 MED ORDER — LIDOCAINE 2% (20 MG/ML) 5 ML SYRINGE
INTRAMUSCULAR | Status: AC
  Filled 2019-09-28: qty 5

## 2019-09-28 MED ORDER — SODIUM CHLORIDE 0.9 % IR SOLN
Status: DC | PRN
  Administered 2019-09-28: 3000 mL

## 2019-09-28 MED ORDER — CEFAZOLIN SODIUM-DEXTROSE 2-4 GM/100ML-% IV SOLN
2.0000 g | Freq: Once | INTRAVENOUS | Status: AC
  Administered 2019-09-28: 02:00:00 2 g via INTRAVENOUS

## 2019-09-28 MED ORDER — PROTAMINE SULFATE 10 MG/ML IV SOLN
INTRAVENOUS | Status: DC | PRN
  Administered 2019-09-28: 20 mg via INTRAVENOUS
  Administered 2019-09-28: 10 mg via INTRAVENOUS
  Administered 2019-09-28: 20 mg via INTRAVENOUS

## 2019-09-28 MED ORDER — PHENYLEPHRINE HCL-NACL 10-0.9 MG/250ML-% IV SOLN
INTRAVENOUS | Status: DC | PRN
  Administered 2019-09-28: 25 ug/min via INTRAVENOUS
  Administered 2019-09-28: 40 ug/min via INTRAVENOUS

## 2019-09-28 MED ORDER — PROPOFOL 10 MG/ML IV BOLUS
INTRAVENOUS | Status: DC | PRN
  Administered 2019-09-28: 50 mg via INTRAVENOUS

## 2019-09-28 MED ORDER — FENTANYL CITRATE (PF) 100 MCG/2ML IJ SOLN
INTRAMUSCULAR | Status: AC
  Filled 2019-09-28: qty 2

## 2019-09-28 MED ORDER — CALCIUM CHLORIDE 10 % IV SOLN
INTRAVENOUS | Status: AC
  Filled 2019-09-28: qty 10

## 2019-09-28 MED ORDER — CHLORHEXIDINE GLUCONATE 0.12% ORAL RINSE (MEDLINE KIT)
15.0000 mL | Freq: Two times a day (BID) | OROMUCOSAL | Status: DC
  Administered 2019-09-28 – 2019-10-11 (×20): 15 mL via OROMUCOSAL

## 2019-09-28 MED ORDER — PROTAMINE SULFATE 10 MG/ML IV SOLN
INTRAVENOUS | Status: AC
  Filled 2019-09-28: qty 5

## 2019-09-28 MED ORDER — CALCIUM CHLORIDE 10 % IV SOLN
INTRAVENOUS | Status: DC | PRN
  Administered 2019-09-28: 1 g via INTRAVENOUS

## 2019-09-28 MED ORDER — SODIUM CHLORIDE 0.9 % IV SOLN
INTRAVENOUS | Status: AC | PRN
  Administered 2019-09-28: 1000 mL via INTRAVENOUS

## 2019-09-28 MED ORDER — 0.9 % SODIUM CHLORIDE (POUR BTL) OPTIME
TOPICAL | Status: DC | PRN
  Administered 2019-09-28: 1000 mL

## 2019-09-28 SURGICAL SUPPLY — 58 items
BANDAGE ESMARK 6X9 LF (GAUZE/BANDAGES/DRESSINGS) IMPLANT
BNDG ELASTIC 6X10 VLCR STRL LF (GAUZE/BANDAGES/DRESSINGS) ×8 IMPLANT
BNDG ESMARK 6X9 LF (GAUZE/BANDAGES/DRESSINGS)
BNDG GAUZE ELAST 4 BULKY (GAUZE/BANDAGES/DRESSINGS) ×8 IMPLANT
CANISTER SUCT 3000ML PPV (MISCELLANEOUS) ×4 IMPLANT
CANNULA VESSEL 3MM 2 BLNT TIP (CANNULA) ×8 IMPLANT
CLIP LIGATING EXTRA MED SLVR (CLIP) ×4 IMPLANT
CLIP LIGATING EXTRA SM BLUE (MISCELLANEOUS) ×4 IMPLANT
COVER WAND RF STERILE (DRAPES) ×4 IMPLANT
CUFF TOURN SGL QUICK 34 (TOURNIQUET CUFF)
CUFF TOURN SGL QUICK 42 (TOURNIQUET CUFF) IMPLANT
CUFF TRNQT CYL 34X4.125X (TOURNIQUET CUFF) IMPLANT
DERMABOND ADVANCED (GAUZE/BANDAGES/DRESSINGS)
DERMABOND ADVANCED .7 DNX12 (GAUZE/BANDAGES/DRESSINGS) IMPLANT
DRAIN SNY 10X20 3/4 PERF (WOUND CARE) IMPLANT
DRAPE HALF SHEET 40X57 (DRAPES) IMPLANT
DRAPE X-RAY CASS 24X20 (DRAPES) IMPLANT
ELECT REM PT RETURN 9FT ADLT (ELECTROSURGICAL) ×4
ELECTRODE REM PT RTRN 9FT ADLT (ELECTROSURGICAL) ×2 IMPLANT
EVACUATOR SILICONE 100CC (DRAIN) IMPLANT
GAUZE SPONGE 4X4 12PLY STRL LF (GAUZE/BANDAGES/DRESSINGS) ×4 IMPLANT
GAUZE XEROFORM 1X8 LF (GAUZE/BANDAGES/DRESSINGS) ×4 IMPLANT
GLOVE BIO SURGEON STRL SZ 6.5 (GLOVE) ×3 IMPLANT
GLOVE BIO SURGEONS STRL SZ 6.5 (GLOVE) ×1
GLOVE BIOGEL M STRL SZ7.5 (GLOVE) ×4 IMPLANT
GLOVE BIOGEL PI IND STRL 7.0 (GLOVE) ×6 IMPLANT
GLOVE BIOGEL PI INDICATOR 7.0 (GLOVE) ×6
GLOVE SS BIOGEL STRL SZ 7.5 (GLOVE) ×2 IMPLANT
GLOVE SUPERSENSE BIOGEL SZ 7.5 (GLOVE) ×2
GOWN STRL NON-REIN LRG LVL3 (GOWN DISPOSABLE) ×4 IMPLANT
GOWN STRL REUS W/ TWL LRG LVL3 (GOWN DISPOSABLE) ×6 IMPLANT
GOWN STRL REUS W/TWL LRG LVL3 (GOWN DISPOSABLE) ×6
INSERT FOGARTY SM (MISCELLANEOUS) IMPLANT
KIT BASIN OR (CUSTOM PROCEDURE TRAY) ×4 IMPLANT
KIT TURNOVER KIT B (KITS) ×4 IMPLANT
NS IRRIG 1000ML POUR BTL (IV SOLUTION) ×8 IMPLANT
PACK PERIPHERAL VASCULAR (CUSTOM PROCEDURE TRAY) ×4 IMPLANT
PAD ABD 8X10 STRL (GAUZE/BANDAGES/DRESSINGS) ×12 IMPLANT
PAD ARMBOARD 7.5X6 YLW CONV (MISCELLANEOUS) ×8 IMPLANT
PADDING CAST COTTON 6X4 STRL (CAST SUPPLIES) IMPLANT
SET COLLECT BLD 21X3/4 12 (NEEDLE) IMPLANT
SPONGE LAP 18X18 RF (DISPOSABLE) ×16 IMPLANT
STAPLER VISISTAT 35W (STAPLE) ×8 IMPLANT
STOPCOCK 4 WAY LG BORE MALE ST (IV SETS) IMPLANT
SUT ETHILON 3 0 PS 1 (SUTURE) IMPLANT
SUT PROLENE 5 0 C 1 24 (SUTURE) ×8 IMPLANT
SUT PROLENE 6 0 CC (SUTURE) ×20 IMPLANT
SUT SILK 2 0 SH (SUTURE) IMPLANT
SUT VIC AB 2-0 CT1 27 (SUTURE) ×4
SUT VIC AB 2-0 CT1 TAPERPNT 27 (SUTURE) ×4 IMPLANT
SUT VIC AB 2-0 CTX 36 (SUTURE) IMPLANT
SUT VIC AB 3-0 SH 27 (SUTURE) ×2
SUT VIC AB 3-0 SH 27X BRD (SUTURE) ×2 IMPLANT
TOWEL GREEN STERILE (TOWEL DISPOSABLE) ×4 IMPLANT
TRAY FOLEY MTR SLVR 16FR STAT (SET/KITS/TRAYS/PACK) ×4 IMPLANT
TUBING EXTENTION W/L.L. (IV SETS) IMPLANT
UNDERPAD 30X30 (UNDERPADS AND DIAPERS) ×4 IMPLANT
WATER STERILE IRR 1000ML POUR (IV SOLUTION) ×4 IMPLANT

## 2019-09-28 SURGICAL SUPPLY — 62 items
BAG URINE DRAIN 2000ML AR STRL (UROLOGICAL SUPPLIES) ×2 IMPLANT
BIT DRILL 4.3 (BIT) ×3 IMPLANT
BIT DRILL 4.3X300MM (BIT) ×1 IMPLANT
BIT DRILL LONG 3.3 (BIT) ×2 IMPLANT
BIT DRILL QC 3.3X195 (BIT) ×2 IMPLANT
BLADE CLIPPER SURG (BLADE) IMPLANT
BNDG ELASTIC 6X15 VLCR STRL LF (GAUZE/BANDAGES/DRESSINGS) ×2 IMPLANT
BNDG GAUZE ELAST 4 BULKY (GAUZE/BANDAGES/DRESSINGS) ×10 IMPLANT
CAP LOCK NCB (Cap) ×18 IMPLANT
COVER WAND RF STERILE (DRAPES) IMPLANT
DRAPE C-ARM 42X72 X-RAY (DRAPES) ×2 IMPLANT
DRAPE C-ARMOR (DRAPES) ×2 IMPLANT
DRAPE IMP U-DRAPE 54X76 (DRAPES) ×6 IMPLANT
DRAPE ORTHO SPLIT 77X108 STRL (DRAPES) ×2
DRAPE SURG ORHT 6 SPLT 77X108 (DRAPES) ×2 IMPLANT
DRAPE U-SHAPE 47X51 STRL (DRAPES) ×2 IMPLANT
DRILL BIT 4.3 (BIT) ×1
DRSG MEPILEX BORDER 4X4 (GAUZE/BANDAGES/DRESSINGS) ×2 IMPLANT
DRSG MEPILEX BORDER 4X8 (GAUZE/BANDAGES/DRESSINGS) ×2 IMPLANT
DRSG MEPILEX SACRM 8.7X9.8 (GAUZE/BANDAGES/DRESSINGS) ×2 IMPLANT
DRSG PAD ABDOMINAL 8X10 ST (GAUZE/BANDAGES/DRESSINGS) ×12 IMPLANT
DRSG XEROFORM 1X8 (GAUZE/BANDAGES/DRESSINGS) ×4 IMPLANT
ELECT REM PT RETURN 9FT ADLT (ELECTROSURGICAL) ×2
ELECTRODE REM PT RTRN 9FT ADLT (ELECTROSURGICAL) ×1 IMPLANT
GAUZE XEROFORM 5X9 LF (GAUZE/BANDAGES/DRESSINGS) ×4 IMPLANT
GLOVE BIOGEL PI IND STRL 7.0 (GLOVE) ×1 IMPLANT
GLOVE BIOGEL PI INDICATOR 7.0 (GLOVE) ×1
GLOVE ECLIPSE 7.0 STRL STRAW (GLOVE) ×2 IMPLANT
GLOVE SKINSENSE NS SZ7.5 (GLOVE) ×2
GLOVE SKINSENSE STRL SZ7.5 (GLOVE) ×2 IMPLANT
GOWN STRL REIN XL XLG (GOWN DISPOSABLE) ×6 IMPLANT
K-WIRE 2.0 (WIRE) ×2
K-WIRE FXSTD 280X2XNS SS (WIRE) ×2
KIT BASIN OR (CUSTOM PROCEDURE TRAY) ×2 IMPLANT
KIT TURNOVER KIT B (KITS) ×2 IMPLANT
KWIRE FXSTD 280X2XNS SS (WIRE) ×2 IMPLANT
MANIFOLD NEPTUNE II (INSTRUMENTS) ×2 IMPLANT
NS IRRIG 1000ML POUR BTL (IV SOLUTION) ×2 IMPLANT
PACK GENERAL/GYN (CUSTOM PROCEDURE TRAY) ×2 IMPLANT
PACK UNIVERSAL I (CUSTOM PROCEDURE TRAY) ×2 IMPLANT
PAD ARMBOARD 7.5X6 YLW CONV (MISCELLANEOUS) ×4 IMPLANT
PLATE DISTAL FEMUR 18H 355MM (Plate) ×2 IMPLANT
SCREW CORT NCB SELFTAP 5.0X50 (Screw) ×2 IMPLANT
SCREW CORTICAL NCB 5.0X40 (Screw) ×2 IMPLANT
SCREW CORTICAL NCB 5.0X90MM (Screw) ×6 IMPLANT
SCREW NCB 3.5X75X5X6.2XST (Screw) ×1 IMPLANT
SCREW NCB 4.0MX38M (Screw) ×2 IMPLANT
SCREW NCB 5.0X38 (Screw) ×2 IMPLANT
SCREW NCB 5.0X55MM (Screw) ×2 IMPLANT
SCREW NCB 5.0X75MM (Screw) ×1 IMPLANT
SCREW UNI CORTICAL 5.0X14MM (Screw) ×2 IMPLANT
SCREW UNICORTICAL 5.0X14 (Screw) ×2 IMPLANT
SET INTERPULSE LAVAGE W/TIP (ORTHOPEDIC DISPOSABLE SUPPLIES) ×2 IMPLANT
STAPLER SKIN PROX 35W (STAPLE) ×2 IMPLANT
STAPLER VISISTAT 35W (STAPLE) IMPLANT
SUT ETHILON 2 0 FS 18 (SUTURE) IMPLANT
SUT VIC AB 0 CT1 27 (SUTURE)
SUT VIC AB 0 CT1 27XBRD ANBCTR (SUTURE) IMPLANT
SUT VIC AB 2-0 CT1 36 (SUTURE) IMPLANT
TOWEL GREEN STERILE (TOWEL DISPOSABLE) ×4 IMPLANT
TOWEL GREEN STERILE FF (TOWEL DISPOSABLE) ×2 IMPLANT
WATER STERILE IRR 1000ML POUR (IV SOLUTION) ×4 IMPLANT

## 2019-09-28 NOTE — Discharge Instructions (Signed)
   Postoperative instructions:  Weightbearing instructions: non weight bearing to left lower extremity  Keep your dressing and/or splint clean and dry at all times.  You can remove your dressing on post-operative day #3 and change with a dry/sterile dressing or Band-Aids as needed thereafter.    Incision instructions:  Do not soak your incision for 3 weeks after surgery.  If the incision gets wet, pat dry and do not scrub the incision.  Pain control:  You have been given a prescription to be taken as directed for post-operative pain control.  In addition, elevate the operative extremity above the heart at all times to prevent swelling and throbbing pain.  Take over-the-counter Colace, 100mg  by mouth twice a day while taking narcotic pain medications to help prevent constipation.  Follow up appointments: 1) 14 days for suture removal and wound check. 2) Dr. as scheduled.

## 2019-09-28 NOTE — Progress Notes (Signed)
Orthopedic Tech Progress Note Patient Details:  Victor Savage [birth year] [id number]  Ortho Devices Type of Ortho Device: Post (long leg) splint Ortho Device/Splint Location: lle. Ortho Device/Splint Interventions: Ordered, Application, Adjustment  I assisted with pt transfer to and from CT Post Interventions Patient Tolerated: Well Instructions Provided: Care of device, Adjustment of device   [name] 09/28/2019, 4:47 AM

## 2019-09-28 NOTE — Anesthesia Procedure Notes (Signed)
Central Venous Catheter Insertion Performed by: Kipp Brood, MD, anesthesiologist Start/End3/05/2020 5:50 PM, 09/28/2019 6:00 PM Patient location: OR. Preanesthetic checklist: patient identified, IV checked, site marked, risks and benefits discussed, surgical consent, monitors and equipment checked, pre-op evaluation, timeout performed and anesthesia consent Lidocaine 1% used for infiltration and patient sedated Hand hygiene performed  and maximum sterile barriers used  Catheter size: 8 Fr Total catheter length 16. Central line was placed.Double lumen Procedure performed using ultrasound guided technique. Ultrasound Notes:anatomy identified, needle tip was noted to be adjacent to the nerve/plexus identified, no ultrasound evidence of intravascular and/or intraneural injection and image(s) printed for medical record Attempts: 1 Following insertion, dressing applied and line sutured. Post procedure assessment: blood return through all ports  Patient tolerated the procedure well with no immediate complications.
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
## 2019-09-28 NOTE — Transfer of Care (Signed)
Immediate Anesthesia Transfer of Care Note  Patient: Victor Savage  Procedure(s) Performed: OPEN REDUCTION INTERNAL FIXATION (ORIF) DISTAL FEMUR FRACTURE (Left Leg Upper)  Patient Location: ICU  Anesthesia Type:General  Level of Consciousness: Patient remains intubated per anesthesia plan  Airway & Oxygen Therapy: Patient remains intubated per anesthesia plan and Patient placed on Ventilator (see vital sign flow sheet for setting)  Post-op Assessment: Report given to RN and Post -op Vital signs reviewed and stable  Post vital signs: Reviewed and stable  Last Vitals:  Vitals Value Taken Time  BP    Temp    Pulse 57 09/28/19 1831  Resp 14 09/28/19 1831  SpO2 100 % 09/28/19 1831  Vitals shown include unvalidated device data.  Last Pain:  Vitals:   09/28/19 1200  TempSrc: Oral  PainSc:          Complications: No apparent anesthesia complications
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
## 2019-09-28 NOTE — Anesthesia Preprocedure Evaluation (Signed)
Anesthesia Evaluation  Patient identified by MRN, date of birth, ID band Patient awake  Preop documentation limited or incomplete due to emergent nature of procedure.  Airway Mallampati: II  TM Distance: >3 FB     Dental   Pulmonary Current Smoker,    breath sounds clear to auscultation       Cardiovascular hypertension,  Rhythm:Regular Rate:Normal     Neuro/Psych    GI/Hepatic (+) Hepatitis -  Endo/Other  diabetes  Renal/GU      Musculoskeletal   Abdominal   Peds  Hematology   Anesthesia Other Findings   Reproductive/Obstetrics                             Anesthesia Physical Anesthesia Plan  ASA: III  Anesthesia Plan: General   Post-op Pain Management:    Induction: Intravenous, Rapid sequence and Cricoid pressure planned  PONV Risk Score and Plan: 2 and Ondansetron and Midazolam  Airway Management Planned: Oral ETT  Additional Equipment:   Intra-op Plan:   Post-operative Plan: Possible Post-op intubation/ventilation  Informed Consent: I have reviewed the patients History and Physical, chart, labs and discussed the procedure including the risks, benefits and alternatives for the proposed anesthesia with the patient or authorized representative who has indicated his/her understanding and acceptance.     Dental advisory given  Plan Discussed with: CRNA and Anesthesiologist  Anesthesia Plan Comments:         Anesthesia Quick Evaluation

## 2019-09-28 NOTE — Anesthesia Postprocedure Evaluation (Signed)
Anesthesia Post Note  Patient: Victor Savage  Procedure(s) Performed: LEFT FEMORAL-BELOW KNEE POPLITEAL ARTERY BYPASS WITH VEIN (Left ) Right greater saphenous Vein Harvest (Right ) Four compartment Fasciotomy left lower leg (Left ) Insertion Of Traction Pin left tibia (Left )     Patient location during evaluation: ICU Anesthesia Type: General Level of consciousness: patient remains intubated per anesthesia plan Pain management: pain level controlled Vital Signs Assessment: post-procedure vital signs reviewed and stable Respiratory status: patient remains intubated per anesthesia plan Cardiovascular status: stable Postop Assessment: no apparent nausea or vomiting Anesthetic complications: no    Last Vitals:  Vitals:   09/28/19 0725 09/28/19 0732  BP:    Pulse:    Resp:    Temp:  36.4 C  SpO2: 100%     Last Pain:  Vitals:   09/28/19 0732  TempSrc: Axillary  PainSc:                  Druanne Bosques

## 2019-09-28 NOTE — Anesthesia Preprocedure Evaluation (Addendum)
Anesthesia Evaluation  Patient identified by MRN, date of birth, ID band Patient awake    Reviewed: Allergy & Precautions, H&P , NPO status , Patient's Chart, lab work & pertinent test results  Airway Mallampati: Intubated       Dental no notable dental hx. (+) Teeth Intact, Dental Advisory Given   Pulmonary asthma , Current Smoker and Patient abstained from smoking.,    Pulmonary exam normal breath sounds clear to auscultation       Cardiovascular hypertension,  Rhythm:Regular Rate:Normal     Neuro/Psych Anxiety Depression Schizophrenia negative neurological ROS     GI/Hepatic negative GI ROS, (+) Hepatitis -, C  Endo/Other  diabetes  Renal/GU negative Renal ROS  negative genitourinary   Musculoskeletal   Abdominal   Peds  Hematology negative hematology ROS (+)   Anesthesia Other Findings   Reproductive/Obstetrics negative OB ROS                             Anesthesia Physical Anesthesia Plan  ASA: IV  Anesthesia Plan: General   Post-op Pain Management:    Induction: Intravenous  PONV Risk Score and Plan: 1 and Ondansetron  Airway Management Planned: Oral ETT  Additional Equipment:   Intra-op Plan:   Post-operative Plan: Post-operative intubation/ventilation  Informed Consent: I have reviewed the patients History and Physical, chart, labs and discussed the procedure including the risks, benefits and alternatives for the proposed anesthesia with the patient or authorized representative who has indicated his/her understanding and acceptance.     Dental advisory given  Plan Discussed with: CRNA  Anesthesia Plan Comments:         Anesthesia Quick Evaluation

## 2019-09-28 NOTE — Progress Notes (Signed)
Have attempted to call Victor Savage per Patient contact information in chart. No answer X 3. Attempting to reach for surgical consent concerns. Will retry. Surgery scheduled for this afternoon.
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
## 2019-09-28 NOTE — Anesthesia Procedure Notes (Signed)
Arterial Line Insertion Start/End3/05/2020 4:55 AM, 09/28/2019 4:56 AM Performed by: Zollie Scale, CRNA, CRNA  Patient location: OR. Emergency situation Left, radial was placed Catheter size: 20 G Hand hygiene performed  and Seldinger technique used Allen's test indicative of satisfactory collateral circulation Attempts: 1 Procedure performed without using ultrasound guided technique. Following insertion, dressing applied and Biopatch. Post procedure assessment: normal  Patient tolerated the procedure well with no immediate complications.
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
## 2019-09-28 NOTE — ED Notes (Signed)
Patient transported to CT 

## 2019-09-28 NOTE — Transfer of Care (Signed)
Immediate Anesthesia Transfer of Care Note  Patient: Braeton Wolgamott  Procedure(s) Performed: LEFT FEMORAL-BELOW KNEE POPLITEAL ARTERY BYPASS WITH VEIN (Left ) Right greater saphenous Vein Harvest (Right ) Four compartment Fasciotomy left lower leg (Left ) Insertion Of Traction Pin left tibia (Left )  Patient Location: ICU  Anesthesia Type:General  Level of Consciousness: sedated and Patient remains intubated per anesthesia plan  Airway & Oxygen Therapy: Patient remains intubated per anesthesia plan and Patient placed on Ventilator (see vital sign flow sheet for setting)  Post-op Assessment: Report given to RN and Post -op Vital signs reviewed and stable  Post vital signs: Reviewed and stable  Last Vitals:  Vitals Value Taken Time  BP    Temp 36.4 C 09/28/19 0732  Pulse 87 09/28/19 0744  Resp 14 09/28/19 0744  SpO2 100 % 09/28/19 0744  Vitals shown include unvalidated device data.  Last Pain:  Vitals:   09/28/19 0732  TempSrc: Axillary  PainSc:          Complications: No apparent anesthesia complications

## 2019-09-28 NOTE — Anesthesia Procedure Notes (Signed)
Date/Time: 09/28/2019 3:02 PM Performed by: Lovie Chol, CRNA Pre-anesthesia Checklist: Patient identified, Emergency Drugs available, Suction available and Patient being monitored Patient Re-evaluated:Patient Re-evaluated prior to induction Oxygen Delivery Method: Ambu bag Preoxygenation: Pre-oxygenation with 100% oxygen Induction Type: Inhalational induction with existing ETT

## 2019-09-28 NOTE — Progress Notes (Signed)
Orthopedic Tech Progress Note Patient Details:  Victor Savage 10-Jul-1956 [id number]  Musculoskeletal Traction Traction Location: delivered traction to OR. and set up on bed Traction Weight: 15 lbs   Post Interventions Patient Tolerated: Well Instructions Provided: Care of device, Adjustment of device   [name] 09/28/2019, 6:46 AM

## 2019-09-28 NOTE — Progress Notes (Signed)
  Day of Surgery Note    Subjective:  Seen in MICU. On vent. Sedated   Vitals:   09/28/19 1130 09/28/19 1200  BP:    Pulse: 66 66  Resp: 14 14  Temp:  98 F (36.7 C)  SpO2: 100% 100%    Incisions:   Knee immbolizer in place.Bulky dressings to left lower extremity. Dry and intact. Left groin soft. Extremities:  Positive Doppler signals of DP, PT and peroneal arteries Cardiac:  RRR Lungs:  Ventilated  Assessment/Plan:  This is a 64 y.o. male who is s/p left superficial femoral to below-knee popliteal bypass with reverse saphenous vein from right leg, 4 compartment fasciotomies left lower extremity. Traction pin in left proximal tibia.   -To undergo ORIF of left femur.   Wendi Maya, PA-C 09/28/2019 1:52 PM [phone]
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
## 2019-09-28 NOTE — Anesthesia Procedure Notes (Signed)
Procedure Name: Intubation Date/Time: 09/28/2019 3:22 AM Performed by: Jearld Pies, CRNA Pre-anesthesia Checklist: Patient identified, Emergency Drugs available, Suction available and Patient being monitored Patient Re-evaluated:Patient Re-evaluated prior to induction Oxygen Delivery Method: Circle System Utilized Preoxygenation: Pre-oxygenation with 100% oxygen Induction Type: IV induction, Rapid sequence and Cricoid Pressure applied Laryngoscope Size: Mac and 3 Grade View: Grade I Tube type: Oral Tube size: 7.5 mm Number of attempts: 1 Airway Equipment and Method: Stylet and Oral airway Placement Confirmation: ETT inserted through vocal cords under direct vision,  positive ETCO2 and breath sounds checked- equal and bilateral Secured at: 22 cm Tube secured with: Tape Dental Injury: Teeth and Oropharynx as per pre-operative assessment
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
## 2019-09-28 NOTE — ED Notes (Signed)
(  System downtime from [id number])

## 2019-09-28 NOTE — ED Notes (Signed)
Left AC IV infiltrated in CT. Per radiologist, Dr. Margo Aye, left arm elevated, ice pack applied, and will continue frequent extremity checks. Notified Dr. Blinda Leatherwood of the same
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
## 2019-09-29 ENCOUNTER — Encounter: Payer: Self-pay | Admitting: *Deleted

## 2019-09-29 ENCOUNTER — Inpatient Hospital Stay (HOSPITAL_COMMUNITY): Payer: Medicaid Other

## 2019-09-29 LAB — TYPE AND SCREEN
ABO/RH(D): O POS
Antibody Screen: NEGATIVE
Unit division: 0
Unit division: 0
Unit division: 0
Unit division: 0
Unit division: 0
Unit division: 0
Unit division: 0
Unit division: 0
Unit division: 0
Unit division: 0

## 2019-09-29 LAB — BPAM FFP
Blood Product Expiration Date: 202103142359
Blood Product Expiration Date: 202103142359
ISSUE DATE / TIME: 202103110607
ISSUE DATE / TIME: 202103110607
Unit Type and Rh: 6200
Unit Type and Rh: 6200

## 2019-09-29 LAB — CBC
HCT: 31.8 % — ABNORMAL LOW (ref 39.0–52.0)
Hemoglobin: 11.3 g/dL — ABNORMAL LOW (ref 13.0–17.0)
MCH: 31.3 pg (ref 26.0–34.0)
MCHC: 35.5 g/dL (ref 30.0–36.0)
MCV: 88.1 fL (ref 80.0–100.0)
Platelets: 63 10*3/uL — ABNORMAL LOW (ref 150–400)
RBC: 3.61 MIL/uL — ABNORMAL LOW (ref 4.22–5.81)
RDW: 14.7 % (ref 11.5–15.5)
WBC: 6.2 10*3/uL (ref 4.0–10.5)
nRBC: 0 % (ref 0.0–0.2)

## 2019-09-29 LAB — URINALYSIS, ROUTINE W REFLEX MICROSCOPIC
Bilirubin Urine: NEGATIVE
Glucose, UA: NEGATIVE mg/dL
Ketones, ur: NEGATIVE mg/dL
Leukocytes,Ua: NEGATIVE
Nitrite: NEGATIVE
Protein, ur: 100 mg/dL — AB
Specific Gravity, Urine: 1.035 — ABNORMAL HIGH (ref 1.005–1.030)
pH: 5 (ref 5.0–8.0)

## 2019-09-29 LAB — BASIC METABOLIC PANEL
Anion gap: 11 (ref 5–15)
Anion gap: 8 (ref 5–15)
BUN: 10 mg/dL (ref 8–23)
BUN: 8 mg/dL (ref 8–23)
CO2: 26 mmol/L (ref 22–32)
CO2: 26 mmol/L (ref 22–32)
Calcium: 8 mg/dL — ABNORMAL LOW (ref 8.9–10.3)
Calcium: 7.7 mg/dL — ABNORMAL LOW (ref 8.9–10.3)
Chloride: 107 mmol/L (ref 98–111)
Chloride: 107 mmol/L (ref 98–111)
Creatinine, Ser: 1.05 mg/dL (ref 0.61–1.24)
Creatinine, Ser: 0.92 mg/dL (ref 0.61–1.24)
GFR calc Af Amer: >60 mL/min (ref >60)
GFR calc Af Amer: >60 mL/min (ref >60)
GFR calc non Af Amer: >60 mL/min (ref >60)
GFR calc non Af Amer: >60 mL/min (ref >60)
Glucose, Bld: 119 mg/dL — ABNORMAL HIGH (ref 70–99)
Glucose, Bld: 170 mg/dL — ABNORMAL HIGH (ref 70–99)
Potassium: 3.6 mmol/L (ref 3.5–5.1)
Potassium: 3.5 mmol/L (ref 3.5–5.1)
Sodium: 144 mmol/L (ref 135–145)
Sodium: 141 mmol/L (ref 135–145)

## 2019-09-29 LAB — PREPARE FRESH FROZEN PLASMA
Unit division: 0
Unit division: 0

## 2019-09-29 LAB — PREPARE PLATELET PHERESIS: Unit division: 0

## 2019-09-29 LAB — BPAM RBC
Blood Product Expiration Date: 202104022359
Blood Product Expiration Date: 202104062359
Blood Product Expiration Date: 202104062359
Blood Product Expiration Date: 202104072359
Blood Product Expiration Date: 202104102359
Blood Product Expiration Date: 202104102359
Blood Product Expiration Date: 202104102359
Blood Product Expiration Date: 202104112359
Blood Product Expiration Date: 202104112359
Blood Product Expiration Date: 202104132359
ISSUE DATE / TIME: 202103110423
ISSUE DATE / TIME: 202103110423
ISSUE DATE / TIME: 202103110439
ISSUE DATE / TIME: 202103110439
ISSUE DATE / TIME: 202103110557
ISSUE DATE / TIME: 202103110625
ISSUE DATE / TIME: 202103111627
ISSUE DATE / TIME: 202103111627
ISSUE DATE / TIME: 202103111627
ISSUE DATE / TIME: 202103111627
Unit Type and Rh: 5100
Unit Type and Rh: 5100
Unit Type and Rh: 5100
Unit Type and Rh: 5100
Unit Type and Rh: 5100
Unit Type and Rh: 5100
Unit Type and Rh: 5100
Unit Type and Rh: 5100
Unit Type and Rh: 5100
Unit Type and Rh: 5100

## 2019-09-29 LAB — BPAM CRYOPRECIPITATE
Blood Product Expiration Date: 202103112235
Blood Product Expiration Date: 202103112235
ISSUE DATE / TIME: 202103111657
ISSUE DATE / TIME: 202103111657
Unit Type and Rh: 5100
Unit Type and Rh: 5100

## 2019-09-29 LAB — PREPARE CRYOPRECIPITATE
Unit division: 0
Unit division: 0

## 2019-09-29 LAB — GLUCOSE, CAPILLARY
Glucose-Capillary: 104 mg/dL — ABNORMAL HIGH (ref 70–99)
Glucose-Capillary: 125 mg/dL — ABNORMAL HIGH (ref 70–99)
Glucose-Capillary: 142 mg/dL — ABNORMAL HIGH (ref 70–99)
Glucose-Capillary: 149 mg/dL — ABNORMAL HIGH (ref 70–99)
Glucose-Capillary: 97 mg/dL (ref 70–99)

## 2019-09-29 LAB — TRIGLYCERIDES: Triglycerides: 71 mg/dL (ref <150)

## 2019-09-29 LAB — BPAM PLATELET PHERESIS
Blood Product Expiration Date: 202103132359
ISSUE DATE / TIME: 202103111528
Unit Type and Rh: 6200

## 2019-09-29 LAB — ABO/RH: ABO/RH(D): O POS

## 2019-09-29 LAB — CK: Total CK: 29778 U/L — ABNORMAL HIGH (ref 49–397)

## 2019-09-29 MED ORDER — LORAZEPAM 2 MG/ML IJ SOLN
1.0000 mg | INTRAMUSCULAR | Status: DC | PRN
  Administered 2019-09-29: 03:00:00 1 mg via INTRAVENOUS

## 2019-09-29 MED ORDER — CALCIUM GLUCONATE-NACL 1-0.675 GM/50ML-% IV SOLN
1.0000 g | Freq: Once | INTRAVENOUS | Status: AC
  Administered 2019-09-29: 21:00:00 1000 mg via INTRAVENOUS
  Filled 2019-09-29: qty 50

## 2019-09-29 MED ORDER — LORAZEPAM 2 MG/ML IJ SOLN
INTRAMUSCULAR | Status: AC
  Filled 2019-09-29: qty 1

## 2019-09-29 MED ORDER — DEXMEDETOMIDINE HCL IN NACL 400 MCG/100ML IV SOLN
0.4000 ug/kg/h | INTRAVENOUS | Status: DC
  Administered 2019-09-29 – 2019-09-30 (×2): 0.4 ug/kg/h via INTRAVENOUS
  Filled 2019-09-29 (×2): qty 100

## 2019-09-29 MED ORDER — FENTANYL CITRATE (PF) 100 MCG/2ML IJ SOLN
50.0000 ug | INTRAMUSCULAR | Status: DC | PRN
  Administered 2019-09-29 – 2019-10-01 (×8): 50 ug via INTRAVENOUS
  Filled 2019-09-29 (×7): qty 2

## 2019-09-29 MED ORDER — OXYCODONE HCL 5 MG/5ML PO SOLN
10.0000 mg | ORAL | Status: DC | PRN
  Administered 2019-09-29 – 2019-09-30 (×2): 10 mg
  Filled 2019-09-29 (×2): qty 10

## 2019-09-29 MED ORDER — SODIUM CHLORIDE 0.9 % IV SOLN: INTRAVENOUS | Status: DC

## 2019-09-29 MED ORDER — SODIUM BICARBONATE-DEXTROSE 150-5 MEQ/L-% IV SOLN
150.0000 meq | INTRAVENOUS | Status: DC
  Administered 2019-09-29 – 2019-10-02 (×10): 150 meq via INTRAVENOUS
  Filled 2019-09-29 (×20): qty 1000
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
## 2019-09-29 NOTE — Progress Notes (Signed)
   Subjective:  Patient remains intubated.  Objective:   VITALS:   Vitals:   09/29/19 0400 09/29/19 0500 09/29/19 0600 09/29/19 0718  BP:      Pulse: 67 68 72 73  Resp: 14 16 14 14   Temp:    97.8 F (36.6 C)  TempSrc:    Axillary  SpO2: 100% 99% 100% 100%  Weight:      Height:        Dressings c/d/i Palpable pulses Moving feet and ankles from stimuli Compartment full but compressible  Lab Results  Component Value Date   WBC 6.2 09/29/2019   HGB 11.3 (L) 09/29/2019   HCT 31.8 (L) 09/29/2019   MCV 88.1 09/29/2019   PLT 63 (L) 09/29/2019     Assessment/Plan:  1 Day Post-Op s/p ORIF left type IIIC femur fx  - Expected postop acute blood loss anemia - will monitor for symptoms - PT may begin knee, hip and ankle ROM at any time - NWB LLE - lovenox  11/29/2019 09/29/2019, 7:52 AM [phone]

## 2019-09-29 NOTE — Anesthesia Postprocedure Evaluation (Signed)
Anesthesia Post Note  Patient: Victor Savage  Procedure(s) Performed: OPEN REDUCTION INTERNAL FIXATION (ORIF) DISTAL FEMUR FRACTURE (Left Leg Upper)     Patient location during evaluation: ICU Anesthesia Type: General Level of consciousness: patient remains intubated per anesthesia plan and sedated Pain management: pain level controlled Vital Signs Assessment: post-procedure vital signs reviewed and stable Respiratory status: patient remains intubated per anesthesia plan and patient on ventilator - see flowsheet for VS Cardiovascular status: blood pressure returned to baseline Anesthetic complications: no    Last Vitals:  Vitals:   09/29/19 1000 09/29/19 1100  BP:    Pulse: 85 89  Resp: 14 16  Temp:    SpO2: 100% 100%    Last Pain:  Vitals:   09/29/19 0718  TempSrc: Axillary  PainSc:                  Olympia Adelsberger COKER
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
## 2019-09-29 NOTE — Progress Notes (Signed)
Patient ID: Victor Savage, male   DOB: 10-25-1955, 64 y.o.   MRN: [id number] Keeps falling asleep and going apneic on wean. Hold off on extubation.  Has not been able to urinate since foley removed. I am concerned about his rhabdomyolysis so will replace foley. F/U BMET pending.  Violeta Gelinas, MD, MPH, FACS Please use [email or web address] to contact on call provider

## 2019-09-29 NOTE — Progress Notes (Signed)
Patient ID: Victor Savage, male   DOB: [birth year], 64 y.o.   MRN: [id number] Urine rust colored and CK 29,778 C/W rhabdomyolysis. Despite undergoing 4 compartment fasciotomies. Will start bicarb drip and continue NS as well. BMET at 1500.  Violeta Gelinas, MD, MPH, FACS Please use [email or web address] to contact on call provider

## 2019-09-29 NOTE — Progress Notes (Addendum)
  Progress Note    09/29/2019 8:21 AM 1 Day Post-Op  Subjective:  Remains ventilated/sedated   Underwent removal of traction pin and ORIF of left femur fx yesterday  Vitals:   09/29/19 0600 09/29/19 0718  BP:    Pulse: 72 73  Resp: 14 14  Temp:  97.8 F (36.6 C)  SpO2: 100% 100%    Physical Exam: Cardiac:  RRR Lungs:  CTA bil Incisions:  Ace wrap in place from groin to ankle Extremities:  Mild left foot/ankle edema. Brisk, monophasic DP, PT and peroneal signals. Left groin soft Abdomen:  soft  CBC    Component Value Date/Time   WBC 6.2 09/29/2019 0407   RBC 3.61 (L) 09/29/2019 0407   HGB 11.3 (L) 09/29/2019 0407   HCT 31.8 (L) 09/29/2019 0407   PLT 63 (L) 09/29/2019 0407   MCV 88.1 09/29/2019 0407   MCH 31.3 09/29/2019 0407   MCHC 35.5 09/29/2019 0407   RDW 14.7 09/29/2019 0407    BMET    Component Value Date/Time   NA 144 09/29/2019 0407   K 3.6 09/29/2019 0407   CL 107 09/29/2019 0407   CO2 26 09/29/2019 0407   GLUCOSE 119 (H) 09/29/2019 0407   BUN 10 09/29/2019 0407   CREATININE 1.05 09/29/2019 0407   CALCIUM 8.0 (L) 09/29/2019 0407   GFRNONAA >60 09/29/2019 0407   GFRAA >60 09/29/2019 0407     Intake/Output Summary (Last 24 hours) at 09/29/2019 0821 Last data filed at 09/29/2019 0600 Gross per 24 hour  Intake 5380.56 ml  Output 3125 ml  Net 2255.56 ml    HOSPITAL MEDICATIONS Scheduled Meds: . chlorhexidine gluconate (MEDLINE KIT)  15 mL Mouth Rinse BID  . Chlorhexidine Gluconate Cloth  6 each Topical Q0600  . fentaNYL (SUBLIMAZE) injection  50 mcg Intravenous Once  . LORazepam      . mouth rinse  15 mL Mouth Rinse 10 times per day  . pantoprazole (PROTONIX) IV  40 mg Intravenous Q24H   Continuous Infusions: . sodium chloride    . fentaNYL infusion INTRAVENOUS 150 mcg/hr (09/28/19 2332)  . propofol (DIPRIVAN) infusion 50 mcg/kg/min (09/28/19 2047)   PRN Meds:.docusate, fentaNYL, LORazepam, metoprolol tartrate  Assessment:  64 y.o.  male is s/p: left superficial femoral to below-knee popliteal bypass with reverse saphenous vein from right leg, 4 compartment fasciotomies left lower extremity. Traction pin in left proximal tibia.  1 Day Post-Op  Plan: -Discuss plan for dressing changes to left calf fasciotomy sites   Risa Grill, PA-C [facility] [phone] 09/29/2019  8:21 AM   I have examined the patient, reviewed and agree with above.  2-3+ dorsalis pedis pulse.  Dressing is intact.  The patient has skin closed over fasciotomy sites.  Curt Jews, MD 09/29/2019 2:23 PM

## 2019-09-30 LAB — COMPREHENSIVE METABOLIC PANEL
ALT: 119 U/L — ABNORMAL HIGH (ref 0–44)
AST: 514 U/L — ABNORMAL HIGH (ref 15–41)
Albumin: 2.3 g/dL — ABNORMAL LOW (ref 3.5–5.0)
Alkaline Phosphatase: 33 U/L — ABNORMAL LOW (ref 38–126)
Anion gap: 9 (ref 5–15)
BUN: 7 mg/dL — ABNORMAL LOW (ref 8–23)
CO2: 26 mmol/L (ref 22–32)
Calcium: 7.4 mg/dL — ABNORMAL LOW (ref 8.9–10.3)
Chloride: 105 mmol/L (ref 98–111)
Creatinine, Ser: 0.88 mg/dL (ref 0.61–1.24)
GFR calc Af Amer: >60 mL/min (ref >60)
GFR calc non Af Amer: >60 mL/min (ref >60)
Glucose, Bld: 142 mg/dL — ABNORMAL HIGH (ref 70–99)
Potassium: 3.8 mmol/L (ref 3.5–5.1)
Sodium: 140 mmol/L (ref 135–145)
Total Bilirubin: 1.1 mg/dL (ref 0.3–1.2)
Total Protein: 4.6 g/dL — ABNORMAL LOW (ref 6.5–8.1)

## 2019-09-30 LAB — CK: Total CK: 15974 U/L — ABNORMAL HIGH (ref 49–397)

## 2019-09-30 LAB — BASIC METABOLIC PANEL
Anion gap: 9 (ref 5–15)
BUN: 6 mg/dL — ABNORMAL LOW (ref 8–23)
CO2: 26 mmol/L (ref 22–32)
Calcium: 7.5 mg/dL — ABNORMAL LOW (ref 8.9–10.3)
Chloride: 104 mmol/L (ref 98–111)
Creatinine, Ser: 0.96 mg/dL (ref 0.61–1.24)
GFR calc Af Amer: >60 mL/min (ref >60)
GFR calc non Af Amer: >60 mL/min (ref >60)
Glucose, Bld: 145 mg/dL — ABNORMAL HIGH (ref 70–99)
Potassium: 3 mmol/L — ABNORMAL LOW (ref 3.5–5.1)
Sodium: 139 mmol/L (ref 135–145)

## 2019-09-30 LAB — CBC
HCT: 27 % — ABNORMAL LOW (ref 39.0–52.0)
HCT: 27.2 % — ABNORMAL LOW (ref 39.0–52.0)
Hemoglobin: 9.2 g/dL — ABNORMAL LOW (ref 13.0–17.0)
Hemoglobin: 9.2 g/dL — ABNORMAL LOW (ref 13.0–17.0)
MCH: 31 pg (ref 26.0–34.0)
MCH: 30.8 pg (ref 26.0–34.0)
MCHC: 34.1 g/dL (ref 30.0–36.0)
MCHC: 33.8 g/dL (ref 30.0–36.0)
MCV: 90.9 fL (ref 80.0–100.0)
MCV: 91 fL (ref 80.0–100.0)
Platelets: 57 10*3/uL — ABNORMAL LOW (ref 150–400)
Platelets: 58 10*3/uL — ABNORMAL LOW (ref 150–400)
RBC: 2.97 MIL/uL — ABNORMAL LOW (ref 4.22–5.81)
RBC: 2.99 MIL/uL — ABNORMAL LOW (ref 4.22–5.81)
RDW: 14.7 % (ref 11.5–15.5)
RDW: 14.6 % (ref 11.5–15.5)
WBC: 5.3 10*3/uL (ref 4.0–10.5)
WBC: 5.3 10*3/uL (ref 4.0–10.5)
nRBC: 0 % (ref 0.0–0.2)
nRBC: 0 % (ref 0.0–0.2)

## 2019-09-30 LAB — PHOSPHORUS: Phosphorus: 1.9 mg/dL — ABNORMAL LOW (ref 2.5–4.6)

## 2019-09-30 LAB — GLUCOSE, CAPILLARY
Glucose-Capillary: 125 mg/dL — ABNORMAL HIGH (ref 70–99)
Glucose-Capillary: 117 mg/dL — ABNORMAL HIGH (ref 70–99)
Glucose-Capillary: 122 mg/dL — ABNORMAL HIGH (ref 70–99)

## 2019-09-30 LAB — MAGNESIUM: Magnesium: 1.9 mg/dL (ref 1.7–2.4)

## 2019-09-30 MED ORDER — FOLIC ACID 1 MG PO TABS
1.0000 mg | ORAL_TABLET | Freq: Every day | ORAL | Status: DC
  Administered 2019-09-30 – 2019-10-13 (×14): 1 mg via ORAL
  Filled 2019-09-30 (×14): qty 1

## 2019-09-30 MED ORDER — ADULT MULTIVITAMIN W/MINERALS CH
1.0000 | ORAL_TABLET | Freq: Every day | ORAL | Status: DC
  Administered 2019-09-30 – 2019-10-13 (×14): 1 via ORAL
  Filled 2019-09-30 (×14): qty 1

## 2019-09-30 MED ORDER — OXYCODONE HCL 5 MG PO TABS
10.0000 mg | ORAL_TABLET | ORAL | Status: DC | PRN
  Administered 2019-09-30 – 2019-10-02 (×8): 10 mg via ORAL
  Filled 2019-09-30 (×8): qty 2

## 2019-09-30 MED ORDER — LORAZEPAM 2 MG/ML IJ SOLN: 0.0000 mg | Freq: Two times a day (BID) | INTRAMUSCULAR | Status: AC

## 2019-09-30 MED ORDER — THIAMINE HCL 100 MG PO TABS
100.0000 mg | ORAL_TABLET | Freq: Every day | ORAL | Status: DC
  Administered 2019-09-30 – 2019-10-13 (×14): 100 mg via ORAL
  Filled 2019-09-30 (×14): qty 1

## 2019-09-30 MED ORDER — LORAZEPAM 1 MG PO TABS: 1.0000 mg | ORAL_TABLET | ORAL | Status: AC | PRN

## 2019-09-30 MED ORDER — LORAZEPAM 2 MG/ML IJ SOLN: 1.0000 mg | INTRAMUSCULAR | Status: AC | PRN

## 2019-09-30 MED ORDER — POTASSIUM CHLORIDE 10 MEQ/100ML IV SOLN: 10.0000 meq | INTRAVENOUS | Status: DC

## 2019-09-30 MED ORDER — LORAZEPAM 2 MG/ML IJ SOLN: 0.0000 mg | Freq: Four times a day (QID) | INTRAMUSCULAR | Status: AC

## 2019-09-30 MED ORDER — POTASSIUM CHLORIDE 20 MEQ/15ML (10%) PO SOLN
30.0000 meq | Freq: Once | ORAL | Status: AC
  Administered 2019-09-30: 30 meq
  Filled 2019-09-30: qty 30

## 2019-09-30 MED ORDER — THIAMINE HCL 100 MG/ML IJ SOLN
100.0000 mg | Freq: Every day | INTRAMUSCULAR | Status: DC
  Filled 2019-09-30 (×3): qty 2

## 2019-09-30 NOTE — Procedures (Signed)
Extubation Procedure Note  Patient Details:   Name: Sequoyah Counterman DOB: 26-Jun-1956 MRN: [id number]   Airway Documentation:    Vent end date: (not recorded) Vent end time: (not recorded)   Evaluation  O2 sats: stable throughout Complications: No apparent complications Patient did tolerate procedure well. Bilateral Breath Sounds: Clear, Diminished   Yes   Pt was extubated per order and placed on 3 L [place]. Cuff leak was noted prior to extubation and no stridor post extubation. Pt is stable at this time. Rt will continue to monitor.  Merlene Laughter 09/30/2019, 12:01 PM

## 2019-09-30 NOTE — Progress Notes (Signed)
Pt went apneic on wean 10/+5 after 20 minutes. Pt was switched back to full support mode. Will attempt at a later time. RN aware.
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
## 2019-10-01 LAB — URINALYSIS, ROUTINE W REFLEX MICROSCOPIC
Bacteria, UA: NONE SEEN
Bilirubin Urine: NEGATIVE
Glucose, UA: NEGATIVE mg/dL
Ketones, ur: NEGATIVE mg/dL
Leukocytes,Ua: NEGATIVE
Nitrite: NEGATIVE
Protein, ur: NEGATIVE mg/dL
Specific Gravity, Urine: 1.005 (ref 1.005–1.030)
pH: 9 — ABNORMAL HIGH (ref 5.0–8.0)

## 2019-10-01 LAB — GLUCOSE, CAPILLARY
Glucose-Capillary: 122 mg/dL — ABNORMAL HIGH (ref 70–99)
Glucose-Capillary: 112 mg/dL — ABNORMAL HIGH (ref 70–99)
Glucose-Capillary: 136 mg/dL — ABNORMAL HIGH (ref 70–99)
Glucose-Capillary: 128 mg/dL — ABNORMAL HIGH (ref 70–99)
Glucose-Capillary: 124 mg/dL — ABNORMAL HIGH (ref 70–99)
Glucose-Capillary: 133 mg/dL — ABNORMAL HIGH (ref 70–99)

## 2019-10-01 LAB — BASIC METABOLIC PANEL
Anion gap: 7 (ref 5–15)
BUN: <5 mg/dL — ABNORMAL LOW (ref 8–23)
CO2: 30 mmol/L (ref 22–32)
Calcium: 7.6 mg/dL — ABNORMAL LOW (ref 8.9–10.3)
Chloride: 101 mmol/L (ref 98–111)
Creatinine, Ser: 0.8 mg/dL (ref 0.61–1.24)
GFR calc Af Amer: >60 mL/min (ref >60)
GFR calc non Af Amer: >60 mL/min (ref >60)
Glucose, Bld: 130 mg/dL — ABNORMAL HIGH (ref 70–99)
Potassium: 3 mmol/L — ABNORMAL LOW (ref 3.5–5.1)
Sodium: 138 mmol/L (ref 135–145)

## 2019-10-01 LAB — CBC
HCT: 29.7 % — ABNORMAL LOW (ref 39.0–52.0)
Hemoglobin: 10.3 g/dL — ABNORMAL LOW (ref 13.0–17.0)
MCH: 31.2 pg (ref 26.0–34.0)
MCHC: 34.7 g/dL (ref 30.0–36.0)
MCV: 90 fL (ref 80.0–100.0)
Platelets: 83 10*3/uL — ABNORMAL LOW (ref 150–400)
RBC: 3.3 MIL/uL — ABNORMAL LOW (ref 4.22–5.81)
RDW: 13.3 % (ref 11.5–15.5)
WBC: 6.6 10*3/uL (ref 4.0–10.5)
nRBC: 0 % (ref 0.0–0.2)

## 2019-10-01 LAB — CK
Total CK: 18926 U/L — ABNORMAL HIGH (ref 49–397)
Total CK: 15896 U/L — ABNORMAL HIGH (ref 49–397)

## 2019-10-01 MED ORDER — GABAPENTIN 100 MG PO CAPS
100.0000 mg | ORAL_CAPSULE | Freq: Every day | ORAL | Status: DC
  Administered 2019-10-02: 100 mg via ORAL
  Filled 2019-10-01 (×2): qty 1

## 2019-10-01 MED ORDER — CHLORHEXIDINE GLUCONATE 0.12 % MT SOLN
OROMUCOSAL | Status: AC
  Filled 2019-10-01: qty 15

## 2019-10-01 MED ORDER — DIVALPROEX SODIUM ER 500 MG PO TB24
500.0000 mg | ORAL_TABLET | Freq: Every day | ORAL | Status: DC
  Administered 2019-10-01 – 2019-10-12 (×12): 500 mg via ORAL
  Filled 2019-10-01 (×13): qty 1

## 2019-10-01 MED ORDER — POTASSIUM CHLORIDE 20 MEQ/15ML (10%) PO SOLN
30.0000 meq | Freq: Once | ORAL | Status: AC
  Administered 2019-10-01: 09:00:00 30 meq
  Filled 2019-10-01: qty 30

## 2019-10-01 MED ORDER — METHOCARBAMOL 500 MG PO TABS
500.0000 mg | ORAL_TABLET | Freq: Four times a day (QID) | ORAL | Status: DC | PRN
  Filled 2019-10-01: qty 1

## 2019-10-01 MED ORDER — HYDROMORPHONE HCL 1 MG/ML IJ SOLN
0.5000 mg | INTRAMUSCULAR | Status: DC | PRN
  Administered 2019-10-01 (×2): 0.5 mg via INTRAVENOUS
  Filled 2019-10-01 (×2): qty 0.5

## 2019-10-01 MED ORDER — DOCUSATE SODIUM 50 MG/5ML PO LIQD: 100.0000 mg | Freq: Two times a day (BID) | ORAL | Status: DC | PRN

## 2019-10-01 MED ORDER — HEPARIN SODIUM (PORCINE) 5000 UNIT/ML IJ SOLN
5000.0000 [IU] | Freq: Three times a day (TID) | INTRAMUSCULAR | Status: DC
  Administered 2019-10-01 – 2019-10-02 (×4): 5000 [IU] via SUBCUTANEOUS
  Filled 2019-10-01 (×4): qty 1

## 2019-10-01 MED ORDER — METHOCARBAMOL 750 MG PO TABS
750.0000 mg | ORAL_TABLET | Freq: Four times a day (QID) | ORAL | Status: DC
  Administered 2019-10-01 – 2019-10-13 (×44): 750 mg via ORAL
  Filled 2019-10-01 (×48): qty 1

## 2019-10-01 MED ORDER — LABETALOL HCL 5 MG/ML IV SOLN: 20.0000 mg | INTRAVENOUS | Status: DC | PRN

## 2019-10-01 MED ORDER — QUETIAPINE FUMARATE 50 MG PO TABS
50.0000 mg | ORAL_TABLET | Freq: Two times a day (BID) | ORAL | Status: DC
  Administered 2019-10-01 – 2019-10-13 (×25): 50 mg via ORAL
  Filled 2019-10-01 (×25): qty 1

## 2019-10-01 MED ORDER — POTASSIUM CHLORIDE 10 MEQ/100ML IV SOLN: 10.0000 meq | INTRAVENOUS | Status: DC
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
## 2019-10-02 LAB — CBC
HCT: 29.3 % — ABNORMAL LOW (ref 39.0–52.0)
Hemoglobin: 10 g/dL — ABNORMAL LOW (ref 13.0–17.0)
MCH: 31.3 pg (ref 26.0–34.0)
MCHC: 34.1 g/dL (ref 30.0–36.0)
MCV: 91.6 fL (ref 80.0–100.0)
Platelets: 114 10*3/uL — ABNORMAL LOW (ref 150–400)
RBC: 3.2 MIL/uL — ABNORMAL LOW (ref 4.22–5.81)
RDW: 13.2 % (ref 11.5–15.5)
WBC: 4.1 10*3/uL (ref 4.0–10.5)
nRBC: 0 % (ref 0.0–0.2)

## 2019-10-02 LAB — BASIC METABOLIC PANEL
Anion gap: 9 (ref 5–15)
BUN: <5 mg/dL — ABNORMAL LOW (ref 8–23)
CO2: 30 mmol/L (ref 22–32)
Calcium: 7.9 mg/dL — ABNORMAL LOW (ref 8.9–10.3)
Chloride: 101 mmol/L (ref 98–111)
Creatinine, Ser: 0.96 mg/dL (ref 0.61–1.24)
GFR calc Af Amer: >60 mL/min (ref >60)
GFR calc non Af Amer: >60 mL/min (ref >60)
Glucose, Bld: 153 mg/dL — ABNORMAL HIGH (ref 70–99)
Potassium: 3 mmol/L — ABNORMAL LOW (ref 3.5–5.1)
Sodium: 140 mmol/L (ref 135–145)

## 2019-10-02 LAB — CK: Total CK: 13412 U/L — ABNORMAL HIGH (ref 49–397)

## 2019-10-02 LAB — GLUCOSE, CAPILLARY: Glucose-Capillary: 141 mg/dL — ABNORMAL HIGH (ref 70–99)

## 2019-10-02 MED ORDER — POTASSIUM CHLORIDE CRYS ER 20 MEQ PO TBCR
40.0000 meq | EXTENDED_RELEASE_TABLET | Freq: Once | ORAL | Status: AC
  Administered 2019-10-02: 13:00:00 40 meq via ORAL
  Filled 2019-10-02: qty 2

## 2019-10-02 MED ORDER — ENOXAPARIN SODIUM 40 MG/0.4ML ~~LOC~~ SOLN
30.0000 mg | Freq: Two times a day (BID) | SUBCUTANEOUS | Status: DC
  Administered 2019-10-02 – 2019-10-13 (×23): 30 mg via SUBCUTANEOUS
  Filled 2019-10-02 (×23): qty 0.4

## 2019-10-02 MED ORDER — ACETAMINOPHEN 500 MG PO TABS
1000.0000 mg | ORAL_TABLET | Freq: Four times a day (QID) | ORAL | Status: DC
  Administered 2019-10-02 – 2019-10-13 (×39): 1000 mg via ORAL
  Filled 2019-10-02 (×43): qty 2

## 2019-10-02 MED ORDER — AMLODIPINE BESYLATE 5 MG PO TABS
5.0000 mg | ORAL_TABLET | Freq: Every day | ORAL | Status: DC
  Administered 2019-10-02 – 2019-10-13 (×12): 5 mg via ORAL
  Filled 2019-10-02 (×12): qty 1

## 2019-10-02 MED ORDER — DOCUSATE SODIUM 100 MG PO CAPS
100.0000 mg | ORAL_CAPSULE | Freq: Two times a day (BID) | ORAL | Status: DC
  Administered 2019-10-02 – 2019-10-13 (×20): 100 mg via ORAL
  Filled 2019-10-02 (×21): qty 1

## 2019-10-02 MED ORDER — POTASSIUM CHLORIDE 10 MEQ/100ML IV SOLN
10.0000 meq | INTRAVENOUS | Status: AC
  Administered 2019-10-02 (×6): 10 meq via INTRAVENOUS
  Filled 2019-10-02 (×6): qty 100

## 2019-10-02 MED ORDER — OXYCODONE HCL 5 MG PO TABS
5.0000 mg | ORAL_TABLET | Freq: Four times a day (QID) | ORAL | Status: DC | PRN
  Administered 2019-10-02 – 2019-10-12 (×10): 10 mg via ORAL
  Filled 2019-10-02 (×10): qty 2

## 2019-10-02 MED ORDER — POTASSIUM CHLORIDE 20 MEQ/15ML (10%) PO SOLN: 40.0000 meq | Freq: Once | ORAL | Status: DC

## 2019-10-02 MED ORDER — OXYCODONE HCL ER 15 MG PO T12A
15.0000 mg | EXTENDED_RELEASE_TABLET | Freq: Two times a day (BID) | ORAL | Status: DC
  Administered 2019-10-02 – 2019-10-06 (×8): 15 mg via ORAL
  Filled 2019-10-02 (×8): qty 1

## 2019-10-02 MED ORDER — HYDROMORPHONE HCL 1 MG/ML IJ SOLN
0.5000 mg | INTRAMUSCULAR | Status: DC | PRN
  Administered 2019-10-02 – 2019-10-07 (×3): 0.5 mg via INTRAVENOUS
  Filled 2019-10-02: qty 1
  Filled 2019-10-02: qty 0.5
  Filled 2019-10-02: qty 1

## 2019-10-02 MED ORDER — DOCUSATE SODIUM 50 MG/5ML PO LIQD: 100.0000 mg | Freq: Two times a day (BID) | ORAL | Status: DC

## 2019-10-02 NOTE — Addendum Note (Signed)
Addendum  created 10/02/19 0909 by Adair Laundry, CRNA   Order list changed

## 2019-10-02 NOTE — Progress Notes (Signed)
[facility] contacted pt's nurse, Debbie regarding order to dc CL. She reported that pt already has 2 PIV's so she requested CL be removed. She plans to finish pt's potassium runs before discontinuing central line. Debbie further reported that pt is receiving a sodium bicarb drip. Educated that sodium bicarb is a vesicant and [facility] practice would be to administer this medication through a central line. Debbie to discuss with physician.

## 2019-10-02 NOTE — Plan of Care (Signed)
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
## 2019-10-02 NOTE — Addendum Note (Signed)
Addendum  created 10/02/19 0852 by Adair Laundry, CRNA   Order list changed
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
## 2019-10-03 LAB — CBC
HCT: 29.7 % — ABNORMAL LOW (ref 39.0–52.0)
Hemoglobin: 10.1 g/dL — ABNORMAL LOW (ref 13.0–17.0)
MCH: 30.7 pg (ref 26.0–34.0)
MCHC: 34 g/dL (ref 30.0–36.0)
MCV: 90.3 fL (ref 80.0–100.0)
Platelets: 135 10*3/uL — ABNORMAL LOW (ref 150–400)
RBC: 3.29 MIL/uL — ABNORMAL LOW (ref 4.22–5.81)
RDW: 13.2 % (ref 11.5–15.5)
WBC: 3.4 10*3/uL — ABNORMAL LOW (ref 4.0–10.5)
nRBC: 0 % (ref 0.0–0.2)

## 2019-10-03 LAB — BASIC METABOLIC PANEL
Anion gap: 9 (ref 5–15)
BUN: <5 mg/dL — ABNORMAL LOW (ref 8–23)
CO2: 30 mmol/L (ref 22–32)
Calcium: 8.2 mg/dL — ABNORMAL LOW (ref 8.9–10.3)
Chloride: 100 mmol/L (ref 98–111)
Creatinine, Ser: 0.86 mg/dL (ref 0.61–1.24)
GFR calc Af Amer: >60 mL/min (ref >60)
GFR calc non Af Amer: >60 mL/min (ref >60)
Glucose, Bld: 131 mg/dL — ABNORMAL HIGH (ref 70–99)
Potassium: 3.5 mmol/L (ref 3.5–5.1)
Sodium: 139 mmol/L (ref 135–145)

## 2019-10-03 LAB — PHOSPHORUS: Phosphorus: 2.8 mg/dL (ref 2.5–4.6)

## 2019-10-03 LAB — CK: Total CK: 4995 U/L — ABNORMAL HIGH (ref 49–397)

## 2019-10-03 LAB — MAGNESIUM: Magnesium: 1.7 mg/dL (ref 1.7–2.4)

## 2019-10-03 MED ORDER — GABAPENTIN 100 MG PO CAPS
100.0000 mg | ORAL_CAPSULE | Freq: Two times a day (BID) | ORAL | Status: DC
  Administered 2019-10-03 – 2019-10-06 (×7): 100 mg via ORAL
  Filled 2019-10-03 (×7): qty 1

## 2019-10-03 MED ORDER — LORAZEPAM 1 MG PO TABS: 1.0000 mg | ORAL_TABLET | ORAL | Status: AC | PRN

## 2019-10-03 MED ORDER — LORAZEPAM 2 MG/ML IJ SOLN: 1.0000 mg | INTRAMUSCULAR | Status: AC | PRN

## 2019-10-03 MED ORDER — PRO-STAT SUGAR FREE PO LIQD
30.0000 mL | Freq: Two times a day (BID) | ORAL | Status: DC
  Administered 2019-10-03 – 2019-10-07 (×10): 30 mL via ORAL
  Filled 2019-10-03 (×15): qty 30

## 2019-10-03 MED ORDER — SODIUM BICARBONATE 8.4 % IV SOLN
INTRAVENOUS | Status: DC
  Filled 2019-10-03 (×7): qty 150

## 2019-10-03 MED ORDER — ENSURE ENLIVE PO LIQD
237.0000 mL | Freq: Two times a day (BID) | ORAL | Status: DC
  Administered 2019-10-03 – 2019-10-12 (×17): 237 mL via ORAL
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
## 2019-10-04 ENCOUNTER — Inpatient Hospital Stay (HOSPITAL_COMMUNITY): Payer: Medicaid Other

## 2019-10-04 LAB — BASIC METABOLIC PANEL
Anion gap: 11 (ref 5–15)
BUN: 9 mg/dL (ref 8–23)
CO2: 30 mmol/L (ref 22–32)
Calcium: 8.4 mg/dL — ABNORMAL LOW (ref 8.9–10.3)
Chloride: 98 mmol/L (ref 98–111)
Creatinine, Ser: 0.94 mg/dL (ref 0.61–1.24)
GFR calc Af Amer: >60 mL/min (ref >60)
GFR calc non Af Amer: >60 mL/min (ref >60)
Glucose, Bld: 115 mg/dL — ABNORMAL HIGH (ref 70–99)
Potassium: 3.8 mmol/L (ref 3.5–5.1)
Sodium: 139 mmol/L (ref 135–145)

## 2019-10-04 LAB — CBC
HCT: 28.3 % — ABNORMAL LOW (ref 39.0–52.0)
Hemoglobin: 9.6 g/dL — ABNORMAL LOW (ref 13.0–17.0)
MCH: 31.1 pg (ref 26.0–34.0)
MCHC: 33.9 g/dL (ref 30.0–36.0)
MCV: 91.6 fL (ref 80.0–100.0)
Platelets: 172 10*3/uL (ref 150–400)
RBC: 3.09 MIL/uL — ABNORMAL LOW (ref 4.22–5.81)
RDW: 13.3 % (ref 11.5–15.5)
WBC: 4.5 10*3/uL (ref 4.0–10.5)
nRBC: 0 % (ref 0.0–0.2)

## 2019-10-04 LAB — CK: Total CK: 2991 U/L — ABNORMAL HIGH (ref 49–397)

## 2019-10-04 MED ORDER — PHENYLEPHRINE HCL-NACL 10-0.9 MG/250ML-% IV SOLN
INTRAVENOUS | Status: AC
  Filled 2019-10-04: qty 250
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
## 2019-10-04 NOTE — Progress Notes (Addendum)
  Progress Note    10/04/2019 8:55 AM 6 Days Post-Op  Subjective:  No new complaints   Vitals:   10/04/19 0329 10/04/19 0804  BP: 132/69 131/64  Pulse: 86 93  Resp: 15 16  Temp: 98.6 F (37 C) 99 F (37.2 C)  SpO2: 99% 100%   Physical Exam: Lungs:  Non labored on RA Incisions:  RLE vein harvest incision c/d/i; L medial thigh incision c/d/i; fasciotomy incisions healing well with minimal drainage laterally Extremities:  Palpable L DP pulse; calf and thigh are soft Neurologic: minimal dorsi/plantar flexion L ankle  CBC    Component Value Date/Time   WBC 4.5 10/04/2019 0406   RBC 3.09 (L) 10/04/2019 0406   HGB 9.6 (L) 10/04/2019 0406   HCT 28.3 (L) 10/04/2019 0406   PLT 172 10/04/2019 0406   MCV 91.6 10/04/2019 0406   MCH 31.1 10/04/2019 0406   MCHC 33.9 10/04/2019 0406   RDW 13.3 10/04/2019 0406    BMET    Component Value Date/Time   NA 139 10/04/2019 0406   K 3.8 10/04/2019 0406   CL 98 10/04/2019 0406   CO2 30 10/04/2019 0406   GLUCOSE 115 (H) 10/04/2019 0406   BUN 9 10/04/2019 0406   CREATININE 0.94 10/04/2019 0406   CALCIUM 8.4 (L) 10/04/2019 0406   GFRNONAA >60 10/04/2019 0406   GFRAA >60 10/04/2019 0406    INR    Component Value Date/Time   INR 1.1 09/28/2019 0030     Intake/Output Summary (Last 24 hours) at 10/04/2019 0855 Last data filed at 10/04/2019 6599 Gross per 24 hour  Intake 892.66 ml  Output 2240 ml  Net -1347.34 ml     Assessment/Plan:  64 y.o. male is s/p L SFA to popliteal bypass with contralateral vein and fasciotomies 6 Days Post-Op   L foot well perfused with palpable L DP pulse Placement pending    Emilie Rutter, PA-C [facility] [phone] 10/04/2019 8:55 AM  I have examined the patient, reviewed and agree with above.  2-3+ left dorsalis pedis pulse.  Calf muscle soft.  Incisions all healing.  Physical therapy slow progression due to musculoskeletal injuries.  Will follow from the sidelines   Gretta Began, MD 10/04/2019 3:08 PM

## 2019-10-04 NOTE — TOC Progression Note (Addendum)
Transition of Care [facility]) - Progression Note    Patient Details  Name: Victor Savage MRN: [id number] Date of Birth: 22-Jun-1956  Transition of Care [facility]) CM/SW Contact  Oren Section Cleta Alberts, RN Phone Number: 10/04/2019, 5:01 PM  Clinical Narrative:  PT/OT recommending SNF for rehab at discharge.  Pt homeless prior to admission.  Met with pt to discuss discharge recommendations; he is agreeable to SNF for rehab.  Will initiate FL2 and fax out for bed search.  Placement may be difficult as Medicaid is only payor and pt homeless with mental health issues and ETOH history.      Expected Discharge Plan: Skilled Nursing Facility Barriers to Discharge: Homeless with medical needs, Continued Medical Work up  Expected Discharge Plan and Services Expected Discharge Plan: [place]                                               Social Determinants of Health (SDOH) Interventions    Readmission Risk Interventions No flowsheet data found.  Reinaldo Raddle, RN, BSN  Trauma/Neuro ICU Case Manager [phone]
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
## 2019-10-05 MED ORDER — POLYETHYLENE GLYCOL 3350 17 G PO PACK
17.0000 g | PACK | Freq: Two times a day (BID) | ORAL | Status: DC
  Administered 2019-10-05 – 2019-10-07 (×4): 17 g via ORAL
  Filled 2019-10-05 (×9): qty 1
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
## 2019-10-06 MED ORDER — OXYCODONE HCL ER 10 MG PO T12A
10.0000 mg | EXTENDED_RELEASE_TABLET | Freq: Two times a day (BID) | ORAL | Status: DC
  Administered 2019-10-06 – 2019-10-13 (×14): 10 mg via ORAL
  Filled 2019-10-06 (×14): qty 1

## 2019-10-06 MED ORDER — LACTATED RINGERS IV SOLN: INTRAVENOUS | Status: DC

## 2019-10-06 MED ORDER — GABAPENTIN 100 MG PO CAPS
200.0000 mg | ORAL_CAPSULE | Freq: Two times a day (BID) | ORAL | Status: DC
  Administered 2019-10-06 – 2019-10-13 (×14): 200 mg via ORAL
  Filled 2019-10-06 (×14): qty 2

## 2019-10-06 MED ORDER — CISATRACURIUM BESYLATE 20 MG/10ML IV SOLN
INTRAVENOUS | Status: AC
  Filled 2019-10-06: qty 10
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
## 2019-10-07 ENCOUNTER — Inpatient Hospital Stay (HOSPITAL_COMMUNITY): Payer: Medicaid Other

## 2019-10-07 MED ORDER — BISACODYL 10 MG RE SUPP: 10.0000 mg | Freq: Once | RECTAL | Status: DC
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
## 2019-10-08 LAB — BASIC METABOLIC PANEL
Anion gap: 11 (ref 5–15)
BUN: 17 mg/dL (ref 8–23)
CO2: 27 mmol/L (ref 22–32)
Calcium: 8.9 mg/dL (ref 8.9–10.3)
Chloride: 99 mmol/L (ref 98–111)
Creatinine, Ser: 0.95 mg/dL (ref 0.61–1.24)
GFR calc Af Amer: >60 mL/min (ref >60)
GFR calc non Af Amer: >60 mL/min (ref >60)
Glucose, Bld: 118 mg/dL — ABNORMAL HIGH (ref 70–99)
Potassium: 4.4 mmol/L (ref 3.5–5.1)
Sodium: 137 mmol/L (ref 135–145)

## 2019-10-08 LAB — PHOSPHORUS: Phosphorus: 4 mg/dL (ref 2.5–4.6)

## 2019-10-08 LAB — MAGNESIUM: Magnesium: 1.8 mg/dL (ref 1.7–2.4)

## 2019-10-08 MED ORDER — NAPROXEN 250 MG PO TABS
500.0000 mg | ORAL_TABLET | Freq: Three times a day (TID) | ORAL | Status: DC
  Administered 2019-10-08 – 2019-10-13 (×15): 500 mg via ORAL
  Filled 2019-10-08 (×14): qty 2

## 2019-10-08 NOTE — Progress Notes (Signed)
0100: Patient complaining of chest pain. Telemetry monitor showed a 16 beat run of VTach. EKG performed showing NSR. MD on call notified and aware. Patient asymptomatic and resting in bed.
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
## 2019-10-09 NOTE — Plan of Care (Signed)
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
## 2019-10-10 NOTE — Plan of Care (Signed)

## 2019-10-10 NOTE — Progress Notes (Signed)
[facility]  Subjective  - No new complaints.   Objective 119/67 67 98.6 F (37 C) (Oral) 18 100%  Intake/Output Summary (Last 24 hours) at 10/10/2019 0737 Last data filed at 10/10/2019 0500 Gross per 24 hour  Intake 480 ml  Output 1450 ml  Net -970 ml    Left LE palpable pedal pulses, incisions healing well, motor intact Right LE vein harvest site healing well Lungs non labored breathing  Assessment/Planning: POD # 5 Left superficial femoral artery and popliteal artery with reverse saphenous vein bypass From right leg and 4 compartment fasciotomies left lower extremity.  Patent bypass with palpable pedal pulses. OK to remove staples before discharge F/U with Dr. Arbie Cookey in 4 weeks   Mosetta Pigeon 10/10/2019 7:37 AM --  Laboratory Lab Results: No results for input(s): WBC, HGB, HCT, PLT in the last 72 hours. BMET Recent Labs    10/08/19 1601  NA 137  K 4.4  CL 99  CO2 27  GLUCOSE 118*  BUN 17  CREATININE 0.95  CALCIUM 8.9    COAG Lab Results  Component Value Date   INR 1.1 09/28/2019   No results found for: PTT

## 2019-10-10 NOTE — TOC Progression Note (Signed)
Transition of Care [facility]) - Progression Note    Patient Details  Name: Victor Savage MRN: [id number] Date of Birth: 1955/09/21  Transition of Care [facility]) CM/SW Contact  Eduard Roux, [place] Phone Number: 10/10/2019, 10:29 AM  Clinical Narrative:     Patient has no bed offers - CSW will continue to follow and assist with discharge planning.  Antony Blackbird, MSW, LCSWA Clinical Social Worker   Expected Discharge Plan: Skilled Nursing Facility Barriers to Discharge: Homeless with medical needs, Continued Medical Work up  Expected Discharge Plan and Services Expected Discharge Plan: Skilled Nursing Facility                                               Social Determinants of Health (SDOH) Interventions    Readmission Risk Interventions No flowsheet data found.
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
## 2019-10-11 ENCOUNTER — Inpatient Hospital Stay (HOSPITAL_COMMUNITY): Payer: Medicaid Other
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
## 2019-10-11 NOTE — Plan of Care (Signed)

## 2019-10-11 NOTE — Care Management (Addendum)
Received call from Beverlyn Roux [phone]) to set up time for level 2 PASRR assessment. She is to call back with time to meet me tomorrow.  Britta Mccreedy will call my cell at 1pm on Thursday to do PASRR over the phone

## 2019-10-11 NOTE — Progress Notes (Signed)
Subjective: 13 Days Post-Op Procedure(s) (LRB): OPEN REDUCTION INTERNAL FIXATION (ORIF) DISTAL FEMUR FRACTURE (Left) Patient reports pain as mild.    Objective: Vital signs in last 24 hours: Temp:  [97.5 F (36.4 C)-98.9 F (37.2 C)] 98.4 F (36.9 C) (03/24 0757) Pulse Rate:  [69-78] 71 (03/24 0757) Resp:  [18-20] 18 (03/24 0757) BP: (119-125)/(63-74) 125/74 (03/24 0757) SpO2:  [100 %] 100 % (03/24 0757)  Intake/Output from previous day: 03/23 0701 - 03/24 0700 In: 960 [P.O.:960] Out: 1800 [Urine:1800] Intake/Output this shift: Total I/O In: -  Out: 200 [Urine:200]  No results for input(s): HGB in the last 72 hours. No results for input(s): WBC, RBC, HCT, PLT in the last 72 hours. Recent Labs    10/08/19 1601  NA 137  K 4.4  CL 99  CO2 27  BUN 17  CREATININE 0.95  GLUCOSE 118*  CALCIUM 8.9   No results for input(s): LABPT, INR in the last 72 hours.  Neurologically intact Dorsiflexion/Plantar flexion intact Incision: dressing C/D/I No cellulitis present Compartment soft  No bandage to LLE Staples intact without signs of infection/cellulitis   Assessment/Plan: 13 Days Post-Op Procedure(s) (LRB): OPEN REDUCTION INTERNAL FIXATION (ORIF) DISTAL FEMUR FRACTURE (Left) Up with therapy  NWB LLE- ok for left hip/knee/ankle ROM Order placed to removal staples.  Please apply steri strips Order placed for x-rays left femur F/u with Dr. Roda Shutters in 4 weeks D/c dispo per primary team     Victor Savage 10/11/2019, 10:19 AM
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
## 2019-10-12 NOTE — TOC Progression Note (Signed)
Transition of Care [facility]) - Progression Note    Patient Details  Name: Victor Savage MRN: [id number] Date of Birth: 02-Aug-1955  [facility]) CM/SW Contact  Eduard Roux, [place] Phone Number: 10/12/2019, 4:38 PM  Clinical Narrative:     CSW visit with the patient at bedside to continue discussing discharge plan. Patient gave CSW permission to contact his friend, Jill Alexanders [phone] to confirm discharge plan. Jill Alexanders verified patient is able to rent a room at boarding house [address] in [place]. He was agreeable to any [facility] services the patient may need if able to arrange.   RN and RNCM updated.  Antony Blackbird, MSW, LCSWA Clinical Social Worker   Expected Discharge Plan: Skilled Nursing Facility Barriers to Discharge: Homeless with medical needs  Expected Discharge Plan and Services Expected Discharge Plan: Skilled Nursing Facility                                               Social Determinants of Health (SDOH) Interventions    Readmission Risk Interventions No flowsheet data found.

## 2019-10-12 NOTE — Care Management (Signed)
Completed PASRR eval w B Atkins over the phone.

## 2019-10-12 NOTE — Progress Notes (Signed)
PT approached RN stating that pts hand slipped while attempting to grab bed rail and went down onto his elbow. RN went into room to check on pt. Pt very upset stating that he did not understand why PT let him use the crutches and did not have any help with her. Stated his hand slipped. Tiny skin tear on L elbow. Tiny amount of bleeding from LLE outer calf where steristrips are.
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
## 2019-10-13 MED ORDER — GABAPENTIN 100 MG PO CAPS: 200.0000 mg | ORAL_CAPSULE | Freq: Two times a day (BID) | ORAL | 0 refills | Status: AC | PRN

## 2019-10-13 MED ORDER — AMLODIPINE BESYLATE 5 MG PO TABS: 5.0000 mg | ORAL_TABLET | Freq: Every day | ORAL | 0 refills | Status: AC

## 2019-10-13 MED ORDER — OXYCODONE HCL 10 MG PO TABS: 5.0000 mg | ORAL_TABLET | Freq: Four times a day (QID) | ORAL | 0 refills | Status: DC | PRN

## 2019-10-13 MED ORDER — METHOCARBAMOL 750 MG PO TABS: 750.0000 mg | ORAL_TABLET | Freq: Three times a day (TID) | ORAL | 0 refills | Status: DC | PRN
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
## 2019-10-13 NOTE — Progress Notes (Signed)
Pt given discharge instructions. All questions and concerns address, and pt voiced understanding. He is being transported via personal vehicle by a friend name Erie Noe. DME delivered to the room: Wheelchair and walker. OT in room assisting with dressings. Pt does not have any clothes, paper scrubs provided for discharge. No c/o pain or discomfort. No s/s of distress noted. Safety measures intact. Vital signs stable. Pt is transported via wheelchair off unit to personal vehicle.
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
## 2019-10-13 NOTE — TOC Transition Note (Signed)
Transition of Care [facility]) - CM/SW Discharge Note Donn Pierini RN,BSN Transitions of Care Unit 4NP (non trauma) - RN Case Manager [phone]   Patient Details  Name: Victor Savage MRN: [id number] Date of Birth: March 29, 1956  Transition of Care [facility]) CM/SW Contact:  Darrold Span, RN Phone Number: 10/13/2019, 12:13 PM   Clinical Narrative:    Pt stable for transition home today, CM has reached out to [place] regarding transportation needs however at this time have not received a return call. Orders have been placed for HHPT/OT and DME RW/ wheelchair. CM spoke with pt at bedside- pt is also trying to reach [place] along with other friends for transportation and housing needs. While this CM was in the room pt was able to reach [place]- who was able to confirm that she could be here at the hospital for transport at 1pm. Per further conversation with the pt - he is still unable to confirm the address where he will be staying in order to arrange [facility] services. Explained to pt that we need an address were he will be staying consistently in order to arrange [facility] services otherwise they will not be able to provide the recommended HH. Pt has refused SNF placement. Pt voiced understanding however still states that he is trying to work out final arrangements- he states that he will either be at "[employer]" boarding house or another friends and if he has to will stay at a shelter tonight until he can figure it out. Pt provided this CM contact info so that if he has a place to stay that he will be at for the next several weeks for [facility] to come see him- he can call and provide that info so that CM can see if a [facility] agency can be secured. Pt voiced understanding.  List also provided for choice Per CMS guidelines from medicare.gov website with star ratings (copy placed in shadow chart) that pt will take with him.  Call made to [facility] with [employer] for DME needs- RW and wheelchair to be delivered to room prior to discharge.       Final next level of care: Home w Home Health Services Barriers to Discharge: Barriers Resolved   Patient Goals and CMS Choice Patient states their goals for this hospitalization and ongoing recovery are:: Get better      Discharge Placement                 Home      Discharge Plan and Services                DME Arranged: Walker rolling, Wheelchair manual DME Agency: [employer] Date DME Agency Contacted: 10/13/19 Time DME Agency Contacted: [phone] Representative spoke with at DME Agency: Ian Malkin HH Arranged: PT, OT          Social Determinants of Health (SDOH) Interventions     Readmission Risk Interventions Readmission Risk Prevention Plan 10/13/2019  Post Dischage Appt Complete  Medication Screening Complete  Transportation Screening Complete

## 2019-10-16 ENCOUNTER — Encounter (HOSPITAL_COMMUNITY): Payer: Self-pay

## 2019-11-07 ENCOUNTER — Encounter: Payer: Medicaid Other | Admitting: Vascular Surgery

## 2019-11-21 ENCOUNTER — Encounter: Payer: Medicaid Other | Admitting: Vascular Surgery

## 2019-11-23 ENCOUNTER — Emergency Department (HOSPITAL_COMMUNITY)
Admission: EM | Admit: 2019-11-23 | Discharge: 2019-11-24 | Disposition: A | Discharge status: Home / Self Care | Payer: Medicaid Other | Attending: Emergency Medicine | Admitting: Emergency Medicine

## 2019-11-23 ENCOUNTER — Other Ambulatory Visit: Payer: Self-pay

## 2019-11-23 ENCOUNTER — Emergency Department (HOSPITAL_COMMUNITY): Payer: Medicaid Other

## 2019-11-23 ENCOUNTER — Encounter (HOSPITAL_COMMUNITY): Payer: Self-pay | Admitting: Emergency Medicine

## 2019-11-23 DIAGNOSIS — J45909 Unspecified asthma, uncomplicated: Secondary | ICD-10-CM | POA: Diagnosis not present

## 2019-11-23 DIAGNOSIS — Z87828 Personal history of other (healed) physical injury and trauma: Secondary | ICD-10-CM

## 2019-11-23 DIAGNOSIS — F17210 Nicotine dependence, cigarettes, uncomplicated: Secondary | ICD-10-CM | POA: Diagnosis not present

## 2019-11-23 DIAGNOSIS — M25462 Effusion, left knee: Secondary | ICD-10-CM

## 2019-11-23 DIAGNOSIS — I10 Essential (primary) hypertension: Secondary | ICD-10-CM | POA: Insufficient documentation

## 2019-11-23 DIAGNOSIS — M25562 Pain in left knee: Secondary | ICD-10-CM | POA: Insufficient documentation

## 2019-11-23 DIAGNOSIS — E119 Type 2 diabetes mellitus without complications: Secondary | ICD-10-CM | POA: Insufficient documentation

## 2019-11-23 DIAGNOSIS — Z7984 Long term (current) use of oral hypoglycemic drugs: Secondary | ICD-10-CM | POA: Insufficient documentation

## 2019-11-23 DIAGNOSIS — R52 Pain, unspecified: Secondary | ICD-10-CM

## 2019-11-23 DIAGNOSIS — Z79899 Other long term (current) drug therapy: Secondary | ICD-10-CM | POA: Diagnosis not present
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
## 2019-11-24 ENCOUNTER — Telehealth: Payer: Self-pay

## 2019-11-24 ENCOUNTER — Telehealth: Payer: Self-pay | Admitting: *Deleted

## 2019-11-24 LAB — CBC WITH DIFFERENTIAL/PLATELET
Abs Immature Granulocytes: 0.01 10*3/uL (ref 0.00–0.07)
Basophils Absolute: 0 10*3/uL (ref 0.0–0.1)
Basophils Relative: 1 %
Eosinophils Absolute: 0.2 10*3/uL (ref 0.0–0.5)
Eosinophils Relative: 6 %
HCT: 40.3 % (ref 39.0–52.0)
Hemoglobin: 12.9 g/dL — ABNORMAL LOW (ref 13.0–17.0)
Immature Granulocytes: 0 %
Lymphocytes Relative: 33 %
Lymphs Abs: 1.3 10*3/uL (ref 0.7–4.0)
MCH: 30.7 pg (ref 26.0–34.0)
MCHC: 32 g/dL (ref 30.0–36.0)
MCV: 96 fL (ref 80.0–100.0)
Monocytes Absolute: 0.5 10*3/uL (ref 0.1–1.0)
Monocytes Relative: 13 %
Neutro Abs: 1.9 10*3/uL (ref 1.7–7.7)
Neutrophils Relative %: 47 %
Platelets: 201 10*3/uL (ref 150–400)
RBC: 4.2 MIL/uL — ABNORMAL LOW (ref 4.22–5.81)
RDW: 14.4 % (ref 11.5–15.5)
WBC: 4 10*3/uL (ref 4.0–10.5)
nRBC: 0 % (ref 0.0–0.2)

## 2019-11-24 LAB — BASIC METABOLIC PANEL
Anion gap: 12 (ref 5–15)
BUN: 10 mg/dL (ref 8–23)
CO2: 26 mmol/L (ref 22–32)
Calcium: 8.8 mg/dL — ABNORMAL LOW (ref 8.9–10.3)
Chloride: 106 mmol/L (ref 98–111)
Creatinine, Ser: 0.81 mg/dL (ref 0.61–1.24)
GFR calc Af Amer: >60 mL/min (ref >60)
GFR calc non Af Amer: >60 mL/min (ref >60)
Glucose, Bld: 105 mg/dL — ABNORMAL HIGH (ref 70–99)
Potassium: 3.4 mmol/L — ABNORMAL LOW (ref 3.5–5.1)
Sodium: 144 mmol/L (ref 135–145)

## 2019-11-24 LAB — SEDIMENTATION RATE: Sed Rate: 8 mm/hr (ref 0–16)

## 2019-11-24 MED ORDER — HYDROCODONE-ACETAMINOPHEN 5-325 MG PO TABS: 1.0000 | ORAL_TABLET | ORAL | 0 refills | Status: DC | PRN

## 2019-11-24 MED ORDER — HYDROCODONE-ACETAMINOPHEN 5-325 MG PO TABS
1.0000 | ORAL_TABLET | Freq: Once | ORAL | Status: AC
  Administered 2019-11-24: 07:00:00 1 via ORAL
  Filled 2019-11-24: qty 1

## 2019-11-24 NOTE — Telephone Encounter (Signed)
Calling in to see when medication  Will be called in received the [facility]. Asked patient about a primary care physician. He states they never do anything. He asked how many pills were prescribed. I answered his question.SUggested he go to [facility] and try to establish a PCP

## 2019-11-24 NOTE — Discharge Instructions (Signed)
You need to see the orthopedic doctor. You also need to get all of your pain medication through him.

## 2019-11-24 NOTE — Telephone Encounter (Signed)
PT called regarding Rx getting transferred to a different pharmacy.  RNCM advised that since Rx is narcotic, transferring to another pharmacy is not an option.
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
## 2019-12-10 ENCOUNTER — Encounter (HOSPITAL_COMMUNITY): Payer: Self-pay | Admitting: Emergency Medicine

## 2019-12-10 ENCOUNTER — Emergency Department (HOSPITAL_BASED_OUTPATIENT_CLINIC_OR_DEPARTMENT_OTHER): Payer: Medicaid Other

## 2019-12-10 ENCOUNTER — Other Ambulatory Visit: Payer: Self-pay

## 2019-12-10 ENCOUNTER — Emergency Department (HOSPITAL_COMMUNITY)
Admission: EM | Admit: 2019-12-10 | Discharge: 2019-12-10 | Disposition: A | Discharge status: Home / Self Care | Payer: Medicaid Other | Attending: Emergency Medicine | Admitting: Emergency Medicine

## 2019-12-10 DIAGNOSIS — Z79899 Other long term (current) drug therapy: Secondary | ICD-10-CM | POA: Diagnosis not present

## 2019-12-10 DIAGNOSIS — M79605 Pain in left leg: Secondary | ICD-10-CM | POA: Insufficient documentation

## 2019-12-10 DIAGNOSIS — B182 Chronic viral hepatitis C: Secondary | ICD-10-CM | POA: Diagnosis not present

## 2019-12-10 DIAGNOSIS — E119 Type 2 diabetes mellitus without complications: Secondary | ICD-10-CM | POA: Diagnosis not present

## 2019-12-10 DIAGNOSIS — M79609 Pain in unspecified limb: Secondary | ICD-10-CM | POA: Diagnosis not present

## 2019-12-10 DIAGNOSIS — R600 Localized edema: Secondary | ICD-10-CM | POA: Diagnosis not present

## 2019-12-10 MED ORDER — GABAPENTIN 300 MG PO CAPS: 300.0000 mg | ORAL_CAPSULE | Freq: Two times a day (BID) | ORAL | 0 refills | Status: AC

## 2019-12-10 MED ORDER — GABAPENTIN 100 MG PO CAPS: 200.0000 mg | ORAL_CAPSULE | Freq: Two times a day (BID) | ORAL | Status: DC

## 2019-12-10 MED ORDER — GABAPENTIN 100 MG PO CAPS
100.0000 mg | ORAL_CAPSULE | Freq: Two times a day (BID) | ORAL | Status: DC
  Administered 2019-12-10: 100 mg via ORAL
  Filled 2019-12-10: qty 1

## 2019-12-10 NOTE — Progress Notes (Signed)
VASCULAR LAB PRELIMINARY  PRELIMINARY  PRELIMINARY  PRELIMINARY  Left lower extremity venous duplex completed.    Preliminary report:  See CV proc for preliminary results.   Called report to ED provider  Daquann Merriott, Sonny Masters, RVT 12/10/2019, 9:32 AM
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
## 2019-12-10 NOTE — Discharge Instructions (Addendum)
You are seen in the emergency department for chronic leg pain.  I have prescribed you gabapentin for pain please use as prescribed.  You may also alternate between using ibuprofen and Tylenol every 6 hours for pain.  To help with the swelling you may use compression stockings and you can ice the area to reduce inflammation.    I highly recommended that you follow-up with your orthopedic doctor, I have attached his name and contact information for you to follow-up with for further management of your leg.  I have also provided you resources for chronic pain and financial assistance. There is information about community health and wellness which can provide you with a primary care provider.  I want you to come back to the emergency department if you experience increasing leg pain and/or swelling, shortness of breath chest pain as these symptoms require further evaluation.
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
## 2019-12-21 ENCOUNTER — Encounter: Payer: Self-pay | Admitting: Surgery

## 2019-12-21 MED ORDER — ONDANSETRON 4 MG PO TBDP
4.00 | ORAL_TABLET | ORAL | Status: DC
Start: ? — End: 2019-12-21

## 2019-12-21 MED ORDER — ZIPRASIDONE MESYLATE 20 MG IM SOLR
20.00 | INTRAMUSCULAR | Status: DC
Start: ? — End: 2019-12-21

## 2019-12-21 MED ORDER — TRAZODONE HCL 50 MG PO TABS
50.00 | ORAL_TABLET | ORAL | Status: DC
Start: ? — End: 2019-12-21

## 2019-12-21 MED ORDER — LORAZEPAM 1 MG PO TABS
1.00 | ORAL_TABLET | ORAL | Status: DC
Start: ? — End: 2019-12-21

## 2019-12-21 MED ORDER — NICOTINE 14 MG/24HR TD PT24
1.00 | MEDICATED_PATCH | TRANSDERMAL | Status: DC
Start: 2019-12-21 — End: 2019-12-21

## 2019-12-25 ENCOUNTER — Ambulatory Visit: Payer: Medicaid Other | Admitting: Orthopedic Surgery

## 2020-01-12 ENCOUNTER — Encounter: Payer: Self-pay | Admitting: Surgery

## 2021-05-23 ENCOUNTER — Ambulatory Visit: Payer: Medicaid Other | Admitting: Medical

## 2021-05-27 ENCOUNTER — Ambulatory Visit: Payer: Medicaid Other | Admitting: Medical

## 2021-07-31 ENCOUNTER — Other Ambulatory Visit: Payer: Self-pay

## 2021-07-31 ENCOUNTER — Emergency Department
Admission: EM | Admit: 2021-07-31 | Discharge: 2021-07-31 | Disposition: A | Discharge status: Home / Self Care | Payer: Medicaid Other | Attending: Emergency Medicine | Admitting: Emergency Medicine

## 2021-07-31 ENCOUNTER — Emergency Department: Payer: Medicaid Other

## 2021-07-31 DIAGNOSIS — J069 Acute upper respiratory infection, unspecified: Secondary | ICD-10-CM | POA: Diagnosis not present

## 2021-07-31 DIAGNOSIS — E119 Type 2 diabetes mellitus without complications: Secondary | ICD-10-CM | POA: Insufficient documentation

## 2021-07-31 DIAGNOSIS — Z20822 Contact with and (suspected) exposure to covid-19: Secondary | ICD-10-CM | POA: Insufficient documentation

## 2021-07-31 DIAGNOSIS — R059 Cough, unspecified: Secondary | ICD-10-CM | POA: Diagnosis present

## 2021-07-31 LAB — RESP PANEL BY RT-PCR (FLU A&B, COVID) ARPGX2
Influenza A by PCR: NEGATIVE
Influenza B by PCR: NEGATIVE
SARS Coronavirus 2 by RT PCR: NEGATIVE

## 2021-07-31 MED ORDER — AZITHROMYCIN 250 MG PO TABS: ORAL_TABLET | ORAL | 0 refills | Status: DC

## 2021-07-31 MED ORDER — BENZONATATE 100 MG PO CAPS
200.0000 mg | ORAL_CAPSULE | Freq: Once | ORAL | Status: AC
  Administered 2021-07-31: 200 mg via ORAL
  Filled 2021-07-31: qty 2

## 2021-07-31 MED ORDER — PSEUDOEPH-BROMPHEN-DM 30-2-10 MG/5ML PO SYRP
5.0000 mL | ORAL_SOLUTION | Freq: Four times a day (QID) | ORAL | 0 refills | Status: DC | PRN
Start: 2021-07-31 — End: 2022-10-01

## 2021-07-31 MED ORDER — PREDNISONE 20 MG PO TABS: 40.0000 mg | ORAL_TABLET | Freq: Every day | ORAL | 0 refills | Status: AC

## 2021-07-31 MED ORDER — BENZONATATE 100 MG PO CAPS: ORAL_CAPSULE | ORAL | 0 refills | Status: DC

## 2021-07-31 NOTE — ED Triage Notes (Signed)
Pt presents to ER c/o cough x3 weeks that is productive.  Pt states last time he had a cough like this he had covid.  Pt c/o some sob with the cough.  Pt speaking in full sentences in triage.  Pt A&O x4 at this time.

## 2021-07-31 NOTE — Discharge Instructions (Addendum)
Take the prescription meds as directed.  Continue to hydrate to prevent dehydration.  Follow-up with your primary provider or local urgent care for ongoing symptoms.  Return to the ED if needed.  Your COVID/flu test results are pending at this time.  You may call back to the ED to confirm results, or you may set up your account and review results on [facility] MyChart.

## 2021-07-31 NOTE — ED Provider Notes (Signed)
[facility] Provider Note  Patient Contact: 8:49 PM (approximate)   History   Cough   HPI  Victor Savage is a 66 y.o. male history of schizophrenia, diabetes type 2, and glaucoma, presents to the ED with complaints of a 2-week cough.  Patient reports intermittent symptoms for the last 2 weeks.  He is currently residing at the [facility].  He reports an intermittent cough, without benefit with OTC medicines including Mucinex.  Denies any fevers, chills, or sweats.  Patient would endorse that the last time he had a cough like this, is when he was diagnosed with COVID.  He denies any chest pain, shortness of breath, or hemoptysis.     Physical Exam   Triage Vital Signs: ED Triage Vitals  Enc Vitals Group     BP 07/31/21 1920 138/72     Pulse Rate 07/31/21 1920 76     Resp 07/31/21 1920 18     Temp 07/31/21 1920 98.5 F (36.9 C)     Temp Source 07/31/21 1920 Oral     SpO2 07/31/21 1920 97 %     Weight 07/31/21 1921 155 lb (70.3 kg)     Height 07/31/21 1921 6' (1.829 m)     Head Circumference --      Peak Flow --      Pain Score 07/31/21 1921 0     Pain Loc --      Pain Edu? --      Excl. in GC? --     Most recent vital signs: Vitals:   07/31/21 1920  BP: 138/72  Pulse: 76  Resp: 18  Temp: 98.5 F (36.9 C)  SpO2: 97%     General: Alert and in no acute distress. Head: No acute traumatic findings Cardiovascular:  Good peripheral perfusion Respiratory: Normal respiratory effort without tachypnea or retractions. Lungs CTAB. Good air entry to the bases with no decreased or absent breath sounds. Intermittent cough noted. Musculoskeletal: Full range of motion to all extremities.  Neurologic:  No gross focal neurologic deficits are appreciated.  Skin:   No rash noted   ED Results / Procedures / Treatments   Labs (all labs ordered are listed, but only abnormal results are displayed) Labs Reviewed  RESP PANEL BY RT-PCR  (FLU A&B, COVID) [id number]     EKG  Vent. rate 80 BPM PR interval 126 ms QRS duration 116 ms QT/QTcB 364/419 ms P-R-T axes 76 64 59 NSR No STEMI   RADIOLOGY  I personally viewed and evaluated these images as part of my medical decision making, as well as reviewing the written report by the radiologist.  ED Provider Interpretation: WNL, NAD  DG Chest 1 View  Result Date: 07/31/2021 CLINICAL DATA:  Cough EXAM: CHEST  1 VIEW COMPARISON:  09/29/2019 FINDINGS: The heart size and mediastinal contours are within normal limits. Both lungs are clear. The visualized skeletal structures are unremarkable. IMPRESSION: No active disease. Electronically Signed   By: Jasmine Pang M.D.   On: 07/31/2021 19:40    PROCEDURES:  Critical Care performed: No  Procedures   MEDICATIONS ORDERED IN ED: Medications  benzonatate (TESSALON) capsule 200 mg (has no administration in time range)     IMPRESSION / MDM / ASSESSMENT AND PLAN / ED COURSE  I reviewed the triage vital signs and the nursing notes.  Differential diagnosis includes, but is not limited to, viral URI, covid, influenza, bronchitis, CAP  Patient's diagnosis is consistent with Viral etiology with cough.  Viral panel screen is negative for COVID or influenza.  Chest x-ray reviewed by me does not reveal any acute infectious process.  No EKG evidence of an ischemic event or malignant arrhythmia.  Patient will be discharged home with prescriptions for Z-Pak, prednisone, Bromfed syrup, and Tessalon Perles. Patient is to follow up with local urgent care community center as needed or otherwise directed. Patient is given ED precautions to return to the ED for any worsening or new symptoms.    FINAL CLINICAL IMPRESSION(S) / ED DIAGNOSES   Final diagnoses:  Cough  Viral URI with cough     Rx / DC Orders   ED Discharge Orders     None        Note:  This document was prepared using Dragon voice  recognition software and may include unintentional dictation errors.    Lissa Hoard, PA-C 07/31/21 2314    Chesley Noon, MD 07/31/21 2355

## 2021-08-07 ENCOUNTER — Other Ambulatory Visit: Payer: Self-pay

## 2021-08-07 ENCOUNTER — Emergency Department
Admission: EM | Admit: 2021-08-07 | Discharge: 2021-08-07 | Disposition: A | Discharge status: Home / Self Care | Payer: Medicaid Other | Attending: Emergency Medicine | Admitting: Emergency Medicine

## 2021-08-07 DIAGNOSIS — R053 Chronic cough: Secondary | ICD-10-CM | POA: Insufficient documentation

## 2021-08-07 DIAGNOSIS — R3911 Hesitancy of micturition: Secondary | ICD-10-CM | POA: Insufficient documentation

## 2021-08-07 DIAGNOSIS — F17200 Nicotine dependence, unspecified, uncomplicated: Secondary | ICD-10-CM | POA: Insufficient documentation

## 2021-08-07 DIAGNOSIS — R300 Dysuria: Secondary | ICD-10-CM | POA: Diagnosis present

## 2021-08-07 DIAGNOSIS — K4091 Unilateral inguinal hernia, without obstruction or gangrene, recurrent: Secondary | ICD-10-CM | POA: Diagnosis not present

## 2021-08-07 DIAGNOSIS — E119 Type 2 diabetes mellitus without complications: Secondary | ICD-10-CM | POA: Insufficient documentation

## 2021-08-07 LAB — CBC
HCT: 37 % — ABNORMAL LOW (ref 39.0–52.0)
Hemoglobin: 12.3 g/dL — ABNORMAL LOW (ref 13.0–17.0)
MCH: 31.4 pg (ref 26.0–34.0)
MCHC: 33.2 g/dL (ref 30.0–36.0)
MCV: 94.4 fL (ref 80.0–100.0)
Platelets: 272 10*3/uL (ref 150–400)
RBC: 3.92 MIL/uL — ABNORMAL LOW (ref 4.22–5.81)
RDW: 11.7 % (ref 11.5–15.5)
WBC: 4.4 10*3/uL (ref 4.0–10.5)
nRBC: 0 % (ref 0.0–0.2)

## 2021-08-07 LAB — URINALYSIS, ROUTINE W REFLEX MICROSCOPIC
Bilirubin Urine: NEGATIVE
Glucose, UA: NEGATIVE mg/dL
Hgb urine dipstick: NEGATIVE
Ketones, ur: NEGATIVE mg/dL
Leukocytes,Ua: NEGATIVE
Nitrite: NEGATIVE
Protein, ur: NEGATIVE mg/dL
Specific Gravity, Urine: 1.016 (ref 1.005–1.030)
pH: 6 (ref 5.0–8.0)

## 2021-08-07 LAB — COMPREHENSIVE METABOLIC PANEL
ALT: 26 U/L (ref 0–44)
AST: 32 U/L (ref 15–41)
Albumin: 3.3 g/dL — ABNORMAL LOW (ref 3.5–5.0)
Alkaline Phosphatase: 58 U/L (ref 38–126)
Anion gap: 8 (ref 5–15)
BUN: 12 mg/dL (ref 8–23)
CO2: 25 mmol/L (ref 22–32)
Calcium: 9 mg/dL (ref 8.9–10.3)
Chloride: 104 mmol/L (ref 98–111)
Creatinine, Ser: 1.03 mg/dL (ref 0.61–1.24)
GFR, Estimated: >60 mL/min (ref >60)
Glucose, Bld: 99 mg/dL (ref 70–99)
Potassium: 3.9 mmol/L (ref 3.5–5.1)
Sodium: 137 mmol/L (ref 135–145)
Total Bilirubin: 0.5 mg/dL (ref 0.3–1.2)
Total Protein: 7.4 g/dL (ref 6.5–8.1)

## 2021-08-07 MED ORDER — TAMSULOSIN HCL 0.4 MG PO CAPS: 0.4000 mg | ORAL_CAPSULE | Freq: Every day | ORAL | 0 refills | Status: AC

## 2021-08-07 NOTE — ED Notes (Signed)
Pt reports living at the shelter, pt states that he has been having right lower abd pain and states that he has a hernia and states that the pain radiates into this right testicle, pt states that when he pushes on the hernia it will go in but it pops right back out. Pt also states that he has a hard time urinating and reports pain with urination and states that this has been going on for awhile

## 2021-08-07 NOTE — ED Triage Notes (Signed)
Pt states that he is having some pain in the right lower quad, states that he has a hernia and each time he pushes it in it pops back out, pt also states that he is having pain with urination and hard to get it out

## 2021-08-07 NOTE — ED Provider Notes (Signed)
[facility] Provider Note    Event Date/Time   First MD Initiated Contact with Patient 08/07/21 1228     (approximate)   History   Hernia and Dysuria   HPI  Victor Savage is a 66 y.o. male   history of schizophrenia, diabetes type 2, glaucoma and tobacco abuse who presents for assessment of several concerns.  Patient states he still has a mild nonproductive cough that has been ongoing for the last month.  He has not had any associated chest pain, shortness of breath, fevers, back pain, headache, earache, sore throat, vomiting or abdominal pain or any other associated symptoms associate with cough.  He is not sure if it is related to his smoking or not.  He states he was seen in the emergency room on 1/12 and had a chest x-ray and COVID done that were both negative but still is coughing.  Second he states he has right-sided groin hernia has been present for at least several weeks goes in and out.  It has never been stuck he is frustrated because he cannot get it to stay in.  It is not particularly painful and he has not had any constipation, urinary symptoms or other abdominal pains associated with this.  He does note that his urine has been darker than normal and he sometimes will feel like he cannot easily initiate his urinary stream although he denies any gross blood in his urine, burning with urination or actual inability urinate.  No recent injuries or falls or any other clear associated sick symptoms today.      Physical Exam  Triage Vital Signs: ED Triage Vitals  Enc Vitals Group     BP --      Pulse --      Resp --      Temp --      Temp src --      SpO2 --      Weight 08/07/21 1058 158 lb (71.7 kg)     Height 08/07/21 1058 6' (1.829 m)     Head Circumference --      Peak Flow --      Pain Score 08/07/21 1057 6     Pain Loc --      Pain Edu? --      Excl. in GC? --     Most recent vital signs: Vitals:   08/07/21 1314  BP: 140/65  Pulse: 83   Resp: 17  Temp: 98.2 F (36.8 C)  SpO2: 98%    General: Awake, no distress.  CV:  Good peripheral perfusion.  No murmurs rubs or gallops.  2+ radial pulses Resp:  Normal effort.  Clear bilaterally. Abd:  No distention.  Soft throughout. Other:  Easily reducible right inguinal hernia.  No overlying skin changes or tenderness.  Penile shaft scrotum and testicles are unremarkable.   ED Results / Procedures / Treatments  Labs (all labs ordered are listed, but only abnormal results are displayed) Labs Reviewed  COMPREHENSIVE METABOLIC PANEL - Abnormal; Notable for the following components:      Result Value   Albumin 3.3 (*)    All other components within normal limits  CBC - Abnormal; Notable for the following components:   RBC 3.92 (*)    Hemoglobin 12.3 (*)    HCT 37.0 (*)    All other components within normal limits  URINALYSIS, ROUTINE W REFLEX MICROSCOPIC - Abnormal; Notable for the following components:   Color, Urine  YELLOW (*)    APPearance CLEAR (*)    All other components within normal limits     EKG    RADIOLOGY  I reviewed patient's x-ray from 1/12 that showed no focal consolidation, effusion, edema, pneumothorax or other clear acute thoracic process.  I also reviewed radiology interpretation and agree with their findings.   PROCEDURES:  Critical Care performed: No   MEDICATIONS ORDERED IN ED: Medications - No data to display   IMPRESSION / MDM / ASSESSMENT AND PLAN / ED COURSE  I reviewed the triage vital signs and the nursing notes.                              With regard to patient's cough differential includes postnasal drip, GERD and possible some chronic bronchitis related to ongoing tobacco abuse.  He has not developed any new shortness of breath or fevers and has no abnormal breath sounds or other associated symptoms to suggest bacterial pneumonia, pneumothorax or other immediate life-threatening thoracic process.  He has no evidence of  hypoxia or increased work of breathing and given chronicity I think this does not require emergent additional work-up.  Discussed importance of tobacco cessation and continued outpatient evaluation with PCP.  Second with regard to his right inguinal hernia this is easily reducible.  There is no evidence of obstruction strangulation or herniation or overlying skin changes.  Discussed routine outpatient surgery follow-up for this.  No indication for emergent surgery at this time.  I suspect patient's urinary hesitancy is unrelated to this and may be related to some BPH.  He is denying any actual burning with urination and his UA is not suggestive of any infection and has no blood.  He states that once he is able to urinate he feels that sometimes the stream is little weak.  Postvoid residual shows no retention.  I think he is stable for continued outpatient evaluation with regards to this I think it is reasonable to trial a course of Flomax.  CBC shows no leukocytosis or acute anemia.  BMP shows no significant electrolyte or metabolic derangements.  Patient stable for discharge with outpatient follow-up.  Discharged in stable condition.  Strict return precautions advised and discussed.  Advised high-fiber diet and limiting in hard work that we will including heavy lifting that will cause hernia to persistently stay popped out with high intra-abdominal pressures.  He is amenable this plan.      FINAL CLINICAL IMPRESSION(S) / ED DIAGNOSES   Final diagnoses:  Chronic cough  Unilateral recurrent inguinal hernia without obstruction or gangrene  Urinary hesitancy     Rx / DC Orders   ED Discharge Orders          Ordered    tamsulosin (FLOMAX) 0.4 MG CAPS capsule  Daily        08/07/21 1317             Note:  This document was prepared using Dragon voice recognition software and may include unintentional dictation errors.   Gilles Chiquito, MD 08/07/21 1318

## 2021-08-07 NOTE — ED Notes (Signed)
This RN called Victor Savage at [phone], per pt request for a ride home. Victor Savage states he is on the way.

## 2021-08-07 NOTE — ED Notes (Signed)
Post void bladder scan performed and pt found to have 45ml's in bladder post void

## 2021-08-22 ENCOUNTER — Ambulatory Visit: Payer: Medicaid Other | Admitting: Surgery
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
## 2022-01-28 ENCOUNTER — Encounter (HOSPITAL_COMMUNITY): Payer: Self-pay

## 2022-01-28 ENCOUNTER — Other Ambulatory Visit: Payer: Self-pay

## 2022-01-28 ENCOUNTER — Emergency Department (HOSPITAL_COMMUNITY)
Admission: EM | Admit: 2022-01-28 | Discharge: 2022-01-28 | Discharge status: Against Medical Advice | Payer: Medicare Other | Attending: Emergency Medicine | Admitting: Emergency Medicine

## 2022-01-28 DIAGNOSIS — R21 Rash and other nonspecific skin eruption: Secondary | ICD-10-CM | POA: Diagnosis present

## 2022-01-28 DIAGNOSIS — Z5321 Procedure and treatment not carried out due to patient leaving prior to being seen by health care provider: Secondary | ICD-10-CM | POA: Insufficient documentation

## 2022-01-28 DIAGNOSIS — L299 Pruritus, unspecified: Secondary | ICD-10-CM | POA: Insufficient documentation

## 2022-01-28 NOTE — ED Notes (Signed)
Patient refused to sign waiver AMA.

## 2022-01-28 NOTE — ED Notes (Signed)
Called and no answer.

## 2022-01-28 NOTE — ED Notes (Signed)
No answer for tirage

## 2022-01-28 NOTE — ED Triage Notes (Signed)
Rash noted to arms, abd chest neck x 2 months. Complains it burns and itches.

## 2022-01-28 NOTE — ED Provider Triage Note (Signed)
Emergency Medicine Provider Triage Evaluation Note  Victor Savage , a 66 y.o. male  was evaluated in triage.  Pt complains of rash and pruritus.  Symptoms have been going on for the last 2 to 3 months.  Patient reports that rash has been getting progressively worse over that time.  Patient states that rash is located to his arms, chest, back.    Denies any new medications, cleaning products, personal hygiene products, exposure to chemicals  Review of Systems  Positive: Rash, pruritus Negative: Oral sores or lesions, facial swelling, trouble breathing, trouble swallowing  Physical Exam  BP 113/73 (BP Location: Right Arm)   Pulse (!) 54   Temp 98 F (36.7 C) (Oral)   Resp 15   SpO2 99%  Gen:   Awake, no distress   Resp:  Normal effort  MSK:   Moves extremities without difficulty  Other:  Patient has scaly plaques noted to abdomen and chest with surrounding excoriation.  Dry skin noted to bilateral elbows.  No sores or lesions to oral mucosa or palms bilaterally.  Medical Decision Making  Medically screening exam initiated at 6:18 PM.  Appropriate orders placed.  Isaiahs Chancy was informed that the remainder of the evaluation will be completed by another provider, this initial triage assessment does not replace that evaluation, and the importance of remaining in the ED until their evaluation is complete.     Haskel Schroeder, [place] 01/28/22 1820

## 2022-02-24 ENCOUNTER — Ambulatory Visit (HOSPITAL_COMMUNITY)
Admission: EM | Admit: 2022-02-24 | Discharge: 2022-02-24 | Disposition: A | Discharge status: Home / Self Care | Payer: Medicare Other | Attending: Emergency Medicine | Admitting: Emergency Medicine

## 2022-02-24 ENCOUNTER — Encounter (HOSPITAL_COMMUNITY): Payer: Self-pay

## 2022-02-24 DIAGNOSIS — K4090 Unilateral inguinal hernia, without obstruction or gangrene, not specified as recurrent: Secondary | ICD-10-CM

## 2022-02-24 DIAGNOSIS — L309 Dermatitis, unspecified: Secondary | ICD-10-CM

## 2022-02-24 DIAGNOSIS — M7918 Myalgia, other site: Secondary | ICD-10-CM | POA: Diagnosis not present

## 2022-02-24 MED ORDER — KETOROLAC TROMETHAMINE 30 MG/ML IJ SOLN
30.0000 mg | Freq: Once | INTRAMUSCULAR | Status: AC
  Administered 2022-02-24: 30 mg via INTRAMUSCULAR

## 2022-02-24 MED ORDER — IBUPROFEN 800 MG PO TABS: 800.0000 mg | ORAL_TABLET | Freq: Three times a day (TID) | ORAL | 0 refills | Status: DC

## 2022-02-24 MED ORDER — KETOROLAC TROMETHAMINE 30 MG/ML IJ SOLN
INTRAMUSCULAR | Status: AC
  Filled 2022-02-24: qty 1

## 2022-02-24 MED ORDER — TRIAMCINOLONE ACETONIDE 0.5 % EX CREA: 1.0000 | TOPICAL_CREAM | Freq: Two times a day (BID) | CUTANEOUS | 0 refills | Status: DC
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
## 2022-02-24 NOTE — ED Triage Notes (Signed)
Patient states having body aches mainly in the shoulders and hips from sleeping outside and on the ground.   Patient states he has a Hernia that has become painful. Also has a rash on the low abdomen to the top of the groin. States it is sore and itching. Onset of rash 2 months.   Patient states he has a callous on the toe of the left foot that he normally gets shaved down but has become painful again.

## 2022-02-24 NOTE — Discharge Instructions (Addendum)
Body pain -You have been given an injection of Toradol here in the office today -You may use ibuprofen 800 mg every 8 hours for comfort at home, may use additional to Tylenol -If sleeping on the ground try to pad it is much as possible with what ever you have available, attempt to get in shelters is much as possible -You may use ice or heat as needed over the affected areas -May massage as tolerated  For your hernia -While present there are no signs of tissue death as the area is soft and movable -Treatment is surgical and you have been given information to the local surgeons office, call for evaluation -At any point if your hernia becomes hard please go to the nearest emergency department for evaluation  For your foot -You have been given information for the podiatry to get site shaved  For your stomach -Your rash is consistent with eczema -Apply Kenalog cream every morning and every evening until cleared -Keep area as moisturized as possible

## 2022-03-06 ENCOUNTER — Encounter: Payer: Self-pay | Admitting: Podiatry

## 2022-03-06 ENCOUNTER — Ambulatory Visit (INDEPENDENT_AMBULATORY_CARE_PROVIDER_SITE_OTHER): Payer: Medicare Other | Admitting: Podiatry

## 2022-03-06 DIAGNOSIS — L84 Corns and callosities: Secondary | ICD-10-CM

## 2022-03-06 DIAGNOSIS — M2042 Other hammer toe(s) (acquired), left foot: Secondary | ICD-10-CM

## 2022-03-06 DIAGNOSIS — M7752 Other enthesopathy of left foot: Secondary | ICD-10-CM

## 2022-03-06 MED ORDER — TRIAMCINOLONE ACETONIDE 10 MG/ML IJ SUSP
10.0000 mg | Freq: Once | INTRAMUSCULAR | Status: AC
  Administered 2022-03-06: 10 mg

## 2022-03-08 NOTE — Progress Notes (Signed)
Subjective:   Patient ID: Victor Savage, male   DOB: 66 y.o.   MRN: [id number]   HPI Patient presents with chronic lesion on the fourth digit left its been very tender and makes it hard for him to wear shoe gear comfortably.  He points to between the fourth and fifth toe states its been present for a fairly long time and he does smoke half a pack per day and tries to stay active   Review of Systems  All other systems reviewed and are negative.       Objective:  Physical Exam Vitals and nursing note reviewed.  Constitutional:      Appearance: He is well-developed.  Pulmonary:     Effort: Pulmonary effort is normal.  Musculoskeletal:        General: Normal range of motion.  Skin:    General: Skin is warm.  Neurological:     Mental Status: He is alert.     Neurovascular status was found to be intact muscle strength was found to be adequate range of motion adequate with inflammation pain around the fourth MPJ left with lesion on the fifth and fourth toe with keratotic tissue formation painful when pressed.  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

## 2022-05-04 ENCOUNTER — Encounter (HOSPITAL_COMMUNITY): Payer: Self-pay

## 2022-05-04 ENCOUNTER — Other Ambulatory Visit: Payer: Self-pay

## 2022-05-04 ENCOUNTER — Emergency Department (HOSPITAL_COMMUNITY)
Admission: EM | Admit: 2022-05-04 | Discharge: 2022-05-05 | Disposition: A | Discharge status: Home / Self Care | Payer: No Typology Code available for payment source | Attending: Student | Admitting: Student

## 2022-05-04 DIAGNOSIS — Z79899 Other long term (current) drug therapy: Secondary | ICD-10-CM | POA: Insufficient documentation

## 2022-05-04 DIAGNOSIS — J45909 Unspecified asthma, uncomplicated: Secondary | ICD-10-CM | POA: Diagnosis not present

## 2022-05-04 DIAGNOSIS — U071 COVID-19: Secondary | ICD-10-CM | POA: Insufficient documentation

## 2022-05-04 DIAGNOSIS — M545 Low back pain, unspecified: Secondary | ICD-10-CM | POA: Diagnosis not present

## 2022-05-04 DIAGNOSIS — Z7984 Long term (current) use of oral hypoglycemic drugs: Secondary | ICD-10-CM | POA: Insufficient documentation

## 2022-05-04 DIAGNOSIS — E119 Type 2 diabetes mellitus without complications: Secondary | ICD-10-CM | POA: Insufficient documentation

## 2022-05-04 DIAGNOSIS — X500XXA Overexertion from strenuous movement or load, initial encounter: Secondary | ICD-10-CM | POA: Diagnosis not present

## 2022-05-04 DIAGNOSIS — G8929 Other chronic pain: Secondary | ICD-10-CM | POA: Diagnosis not present

## 2022-05-04 DIAGNOSIS — M549 Dorsalgia, unspecified: Secondary | ICD-10-CM | POA: Diagnosis present

## 2022-05-04 DIAGNOSIS — I10 Essential (primary) hypertension: Secondary | ICD-10-CM | POA: Diagnosis not present

## 2022-05-04 MED ORDER — KETOROLAC TROMETHAMINE 15 MG/ML IJ SOLN
15.0000 mg | Freq: Once | INTRAMUSCULAR | Status: AC
  Administered 2022-05-05: 15 mg via INTRAMUSCULAR
  Filled 2022-05-04: qty 1

## 2022-05-04 MED ORDER — KETOROLAC TROMETHAMINE 10 MG PO TABS
10.0000 mg | ORAL_TABLET | Freq: Four times a day (QID) | ORAL | 0 refills | Status: DC | PRN
Start: 2022-05-04 — End: 2022-10-01

## 2022-05-04 MED ORDER — LIDOCAINE 5 % EX PTCH: 1.0000 | MEDICATED_PATCH | CUTANEOUS | 0 refills | Status: AC

## 2022-05-04 MED ORDER — DEXAMETHASONE SODIUM PHOSPHATE 10 MG/ML IJ SOLN
10.0000 mg | Freq: Once | INTRAMUSCULAR | Status: AC
  Administered 2022-05-05: 10 mg via INTRAMUSCULAR
  Filled 2022-05-04: qty 1

## 2022-05-04 NOTE — Discharge Instructions (Addendum)
You were seen in the emergency department today for back pain. You have likely strained your back. We gave you a steroid shot and pain shot here in the emergency department. I have prescribed you pain medication for you to take every 6 hours. Please do not take ibuprofen or other NSAIDs with this medication. Please return for worsening symptoms.

## 2022-05-04 NOTE — ED Triage Notes (Signed)
Pt reports lower back pain x 2 months and is also requesting a covid test to get back into the homeless shelter

## 2022-05-04 NOTE — ED Provider Notes (Signed)
[facility] EMERGENCY DEPARTMENT Provider Note   CSN: [id number] Arrival date & time: 05/04/22  1845     History {Add pertinent medical, surgical, social history, OB history to HPI:1} No chief complaint on file.   Victor Savage is a 66 y.o. male. Back pain x2 months. Heavy lifting. Not been seen before. No trauma. No falls. No cauda equina symptoms. No sciatica.      HPI     Home Medications Prior to Admission medications   Medication Sig Start Date End Date Taking? Authorizing Provider  acetaminophen (TYLENOL) 500 MG tablet Take 1,000 mg by mouth every 6 (six) hours as needed for mild pain or headache.    [provider]  amLODipine (NORVASC) 5 MG tablet Take 1 tablet (5 mg total) by mouth daily. 10/13/19   Victor Danker, PA-C  atenolol (TENORMIN) 25 MG tablet Take 25 mg by mouth 2 (two) times daily.    [provider]  azithromycin (ZITHROMAX Z-PAK) 250 MG tablet Take 2 tablets (500 mg) on  Day 1,  followed by 1 tablet (250 mg) once daily on Days 2 through 5. 07/31/21   Victor, Dannielle Karvonen, PA-C  benzonatate (TESSALON PERLES) 100 MG capsule Take 1-2 tabs TID prn cough 07/31/21   Victor, Dannielle Karvonen, PA-C  brompheniramine-pseudoephedrine-DM 30-2-10 MG/5ML syrup Take 5 mLs by mouth 4 (four) times daily as needed. 07/31/21   Victor, Dannielle Karvonen, PA-C  diclofenac (VOLTAREN) 75 MG EC tablet Take 75 mg by mouth 2 (two) times daily.    [provider]  divalproex (DEPAKOTE ER) 500 MG 24 hr tablet Take 2,000 mg by mouth 2 (two) times daily.     [provider]  divalproex (DEPAKOTE ER) 500 MG 24 hr tablet Take 500 mg by mouth at bedtime.    [provider]  FLUoxetine (PROZAC) 20 MG capsule Take 40 mg by mouth daily.     [provider]  gabapentin (NEURONTIN) 100 MG capsule Take 2 capsules (200 mg total) by mouth 2 (two) times daily as needed. 10/13/19   Victor Danker, PA-C  gabapentin (NEURONTIN) 300 MG  capsule Take 1 capsule (300 mg total) by mouth 2 (two) times daily for 15 days. 12/10/19 12/25/19  Victor Fennel, PA-C  ibuprofen (ADVIL) 800 MG tablet Take 1 tablet (800 mg total) by mouth 3 (three) times daily. 02/24/22   White, Victor Schuller, NP  ondansetron (ZOFRAN) 4 MG tablet Take 1 tablet (4 mg total) by mouth every 8 (eight) hours as needed for nausea or vomiting. 11/21/13   Victor Mail, PA-C  QUEtiapine (SEROQUEL) 200 MG tablet Take 400 mg by mouth at bedtime.    [provider]  QUEtiapine (SEROQUEL) 50 MG tablet Take 50 mg by mouth 2 (two) times daily.     [provider]  triamcinolone cream (KENALOG) 0.5 % Apply 1 Application topically 2 (two) times daily. 02/24/22   Victor Eden, NP      Allergies    Patient has no known allergies.    Review of Systems   Review of Systems  Physical Exam Updated Vital Signs There were no vitals taken for this visit. Physical Exam  ED Results / Procedures / Treatments   Labs (all labs ordered are listed, but only abnormal results are displayed) Labs Reviewed - No data to display  EKG None  Radiology No results found.  Procedures Procedures  {Document cardiac monitor, telemetry assessment procedure when appropriate:1}  Medications Ordered  in ED Medications - No data to display  ED Course/ Medical Decision Making/ A&P                           Medical Decision Making  ***  {Document critical care time when appropriate:1} {Document review of labs and clinical decision tools ie heart score, Chads2Vasc2 etc:1}  {Document your independent review of radiology images, and any outside records:1} {Document your discussion with family members, caretakers, and with consultants:1} {Document social determinants of health affecting pt's care:1} {Document your decision making why or why not admission, treatments were needed:1} Final Clinical Impression(s) / ED Diagnoses Final diagnoses:  None    Rx / DC  Orders ED Discharge Orders     None

## 2022-05-05 DIAGNOSIS — M545 Low back pain, unspecified: Secondary | ICD-10-CM | POA: Diagnosis not present

## 2022-05-05 LAB — RESP PANEL BY RT-PCR (FLU A&B, COVID) ARPGX2
Influenza A by PCR: NEGATIVE
Influenza B by PCR: NEGATIVE
SARS Coronavirus 2 by RT PCR: POSITIVE — AB

## 2022-05-08 ENCOUNTER — Encounter (HOSPITAL_COMMUNITY): Payer: Self-pay | Admitting: *Deleted

## 2022-05-08 ENCOUNTER — Other Ambulatory Visit: Payer: Self-pay

## 2022-05-08 ENCOUNTER — Emergency Department (HOSPITAL_COMMUNITY)
Admission: EM | Admit: 2022-05-08 | Discharge: 2022-05-08 | Disposition: A | Discharge status: Home / Self Care | Payer: Medicare Other | Attending: Emergency Medicine | Admitting: Emergency Medicine

## 2022-05-08 DIAGNOSIS — M5430 Sciatica, unspecified side: Secondary | ICD-10-CM | POA: Diagnosis not present

## 2022-05-08 DIAGNOSIS — Z1152 Encounter for screening for COVID-19: Secondary | ICD-10-CM | POA: Insufficient documentation

## 2022-05-08 DIAGNOSIS — Z20822 Contact with and (suspected) exposure to covid-19: Secondary | ICD-10-CM

## 2022-05-08 LAB — RESP PANEL BY RT-PCR (FLU A&B, COVID) ARPGX2
Influenza A by PCR: NEGATIVE
Influenza B by PCR: NEGATIVE
SARS Coronavirus 2 by RT PCR: NEGATIVE

## 2022-05-08 MED ORDER — CELECOXIB 200 MG PO CAPS: 200.0000 mg | ORAL_CAPSULE | Freq: Two times a day (BID) | ORAL | 0 refills | Status: AC
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
## 2022-05-08 NOTE — Discharge Instructions (Signed)
Get help right away for any new or worsening conditions °

## 2022-05-08 NOTE — ED Provider Triage Note (Signed)
Emergency Medicine Provider Triage Evaluation Note  Victor Savage , a 66 y.o. male  was evaluated in triage.  Pt complains of needing covid test to get into a shelter. No sympromgs.  Review of Systems  Positive:  Negative:   Physical Exam  BP 115/76 (BP Location: Right Arm)   Pulse 79   Temp 98.3 F (36.8 C) (Oral)   Resp 18   SpO2 98%  Gen:   Awake, no distress   Resp:  Normal effort  MSK:   Moves extremities without difficulty  Other:  No cough  Medical Decision Making  Medically screening exam initiated at 4:33 PM.  Appropriate orders placed.  Victor Savage was informed that the remainder of the evaluation will be completed by another provider, this initial triage assessment does not replace that evaluation, and the importance of remaining in the ED until their evaluation is complete.  Work up initiated.   Margarita Mail, PA-C 05/08/22 [id number]

## 2022-05-08 NOTE — ED Notes (Signed)
Called by triage tech for discharge papers, no answer.

## 2022-05-08 NOTE — ED Provider Notes (Signed)
[facility] EMERGENCY DEPARTMENT Provider Note   CSN: [id number] Arrival date & time: 05/08/22  1507     History {Add pertinent medical, surgical, social history, OB history to HPI:1} No chief complaint on file.   Victor Savage is a 66 y.o. male.  HPI     Home Medications Prior to Admission medications   Medication Sig Start Date End Date Taking? Authorizing Provider  acetaminophen (TYLENOL) 500 MG tablet Take 1,000 mg by mouth every 6 (six) hours as needed for mild pain or headache.    [provider]  amLODipine (NORVASC) 5 MG tablet Take 1 tablet (5 mg total) by mouth daily. 10/13/19   Barnetta Chapel, PA-C  atenolol (TENORMIN) 25 MG tablet Take 25 mg by mouth 2 (two) times daily.    [provider]  azithromycin (ZITHROMAX Z-PAK) 250 MG tablet Take 2 tablets (500 mg) on  Day 1,  followed by 1 tablet (250 mg) once daily on Days 2 through 5. 07/31/21   Menshew, Charlesetta Ivory, PA-C  benzonatate (TESSALON PERLES) 100 MG capsule Take 1-2 tabs TID prn cough 07/31/21   Menshew, Charlesetta Ivory, PA-C  brompheniramine-pseudoephedrine-DM 30-2-10 MG/5ML syrup Take 5 mLs by mouth 4 (four) times daily as needed. 07/31/21   Menshew, Charlesetta Ivory, PA-C  diclofenac (VOLTAREN) 75 MG EC tablet Take 75 mg by mouth 2 (two) times daily.    [provider]  divalproex (DEPAKOTE ER) 500 MG 24 hr tablet Take 2,000 mg by mouth 2 (two) times daily.     [provider]  divalproex (DEPAKOTE ER) 500 MG 24 hr tablet Take 500 mg by mouth at bedtime.    [provider]  FLUoxetine (PROZAC) 20 MG capsule Take 40 mg by mouth daily.     [provider]  gabapentin (NEURONTIN) 100 MG capsule Take 2 capsules (200 mg total) by mouth 2 (two) times daily as needed. 10/13/19   Barnetta Chapel, PA-C  gabapentin (NEURONTIN) 300 MG capsule Take 1 capsule (300 mg total) by mouth 2 (two) times daily for 15 days. 12/10/19 12/25/19  Carroll Sage, PA-C   ibuprofen (ADVIL) 800 MG tablet Take 1 tablet (800 mg total) by mouth 3 (three) times daily. 02/24/22   White, Elita Boone, NP  ketorolac (TORADOL) 10 MG tablet Take 1 tablet (10 mg total) by mouth every 6 (six) hours as needed. 05/04/22   Cristopher Peru, PA-C  lidocaine (LIDODERM) 5 % Place 1 patch onto the skin daily. Remove & Discard patch within 12 hours or as directed by MD 05/04/22   Cristopher Peru, PA-C  ondansetron (ZOFRAN) 4 MG tablet Take 1 tablet (4 mg total) by mouth every 8 (eight) hours as needed for nausea or vomiting. 11/21/13   Arthor Captain, PA-C  QUEtiapine (SEROQUEL) 200 MG tablet Take 400 mg by mouth at bedtime.    [provider]  QUEtiapine (SEROQUEL) 50 MG tablet Take 50 mg by mouth 2 (two) times daily.     [provider]  triamcinolone cream (KENALOG) 0.5 % Apply 1 Application topically 2 (two) times daily. 02/24/22   Valinda Hoar, NP      Allergies    Patient has no known allergies.    Review of Systems   Review of Systems  Physical Exam Updated Vital Signs BP 115/76 (BP Location: Right Arm)   Pulse 79   Temp 98.3 F (36.8 C) (Oral)   Resp 18   SpO2 98%  Physical Exam  ED Results / Procedures / Treatments   Labs (all labs ordered are listed, but only abnormal results are displayed) Labs Reviewed  RESP PANEL BY RT-PCR (FLU A&B, COVID) ARPGX2    EKG None  Radiology No results found.  Procedures Procedures  {Document cardiac monitor, telemetry assessment procedure when appropriate:1}  Medications Ordered in ED Medications - No data to display  ED Course/ Medical Decision Making/ A&P                           Medical Decision Making  ***  {Document critical care time when appropriate:1} {Document review of labs and clinical decision tools ie heart score, Chads2Vasc2 etc:1}  {Document your independent review of radiology images, and any outside records:1} {Document your discussion with family members, caretakers, and  with consultants:1} {Document social determinants of health affecting pt's care:1} {Document your decision making why or why not admission, treatments were needed:1} Final Clinical Impression(s) / ED Diagnoses Final diagnoses:  None    Rx / DC Orders ED Discharge Orders     None

## 2022-05-10 ENCOUNTER — Emergency Department (HOSPITAL_COMMUNITY)
Admission: EM | Admit: 2022-05-10 | Discharge: 2022-05-10 | Disposition: A | Discharge status: Home / Self Care | Payer: Medicare Other

## 2022-10-01 ENCOUNTER — Emergency Department (HOSPITAL_BASED_OUTPATIENT_CLINIC_OR_DEPARTMENT_OTHER): Payer: Medicare HMO

## 2022-10-01 ENCOUNTER — Encounter (HOSPITAL_COMMUNITY): Payer: Self-pay

## 2022-10-01 ENCOUNTER — Other Ambulatory Visit: Payer: Self-pay

## 2022-10-01 ENCOUNTER — Emergency Department (HOSPITAL_COMMUNITY)
Admission: EM | Admit: 2022-10-01 | Discharge: 2022-10-01 | Disposition: A | Discharge status: Home / Self Care | Payer: Medicare HMO | Attending: Emergency Medicine | Admitting: Emergency Medicine

## 2022-10-01 DIAGNOSIS — M7989 Other specified soft tissue disorders: Secondary | ICD-10-CM

## 2022-10-01 DIAGNOSIS — R21 Rash and other nonspecific skin eruption: Secondary | ICD-10-CM | POA: Diagnosis present

## 2022-10-01 LAB — CBC WITH DIFFERENTIAL/PLATELET
Abs Immature Granulocytes: 0 10*3/uL (ref 0.00–0.07)
Basophils Absolute: 0 10*3/uL (ref 0.0–0.1)
Basophils Relative: 1 %
Eosinophils Absolute: 0.2 10*3/uL (ref 0.0–0.5)
Eosinophils Relative: 6 %
HCT: 40.8 % (ref 39.0–52.0)
Hemoglobin: 13.4 g/dL (ref 13.0–17.0)
Immature Granulocytes: 0 %
Lymphocytes Relative: 30 %
Lymphs Abs: 1.2 10*3/uL (ref 0.7–4.0)
MCH: 32.7 pg (ref 26.0–34.0)
MCHC: 32.8 g/dL (ref 30.0–36.0)
MCV: 99.5 fL (ref 80.0–100.0)
Monocytes Absolute: 0.8 10*3/uL (ref 0.1–1.0)
Monocytes Relative: 19 %
Neutro Abs: 1.8 10*3/uL (ref 1.7–7.7)
Neutrophils Relative %: 44 %
Platelets: 172 10*3/uL (ref 150–400)
RBC: 4.1 MIL/uL — ABNORMAL LOW (ref 4.22–5.81)
RDW: 12 % (ref 11.5–15.5)
WBC: 4 10*3/uL (ref 4.0–10.5)
nRBC: 0 % (ref 0.0–0.2)

## 2022-10-01 LAB — BASIC METABOLIC PANEL
Anion gap: 15 (ref 5–15)
BUN: 15 mg/dL (ref 8–23)
CO2: 21 mmol/L — ABNORMAL LOW (ref 22–32)
Calcium: 8.7 mg/dL — ABNORMAL LOW (ref 8.9–10.3)
Chloride: 104 mmol/L (ref 98–111)
Creatinine, Ser: 0.85 mg/dL (ref 0.61–1.24)
GFR, Estimated: >60 mL/min (ref >60)
Glucose, Bld: 110 mg/dL — ABNORMAL HIGH (ref 70–99)
Potassium: 4.1 mmol/L (ref 3.5–5.1)
Sodium: 140 mmol/L (ref 135–145)

## 2022-10-01 MED ORDER — TRIAMCINOLONE ACETONIDE 0.1 % EX CREA: 1.0000 | TOPICAL_CREAM | Freq: Two times a day (BID) | CUTANEOUS | 0 refills | Status: AC

## 2022-10-01 MED ORDER — PREDNISONE 20 MG PO TABS: 40.0000 mg | ORAL_TABLET | Freq: Every day | ORAL | 0 refills | Status: AC
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
## 2022-10-01 NOTE — Discharge Instructions (Signed)
Apply the triamcinolone ointment twice a day to the areas of rash, prednisone once a day for 5 days.  You will need to see your doctor or the [facility] for follow-up this week.  ER for worsening symptoms
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
## 2022-10-01 NOTE — Progress Notes (Signed)
Left lower extremity venous duplex has been completed. Preliminary results can be found in CV Proc through chart review.  Results were given to [place] PA.  10/01/22 2:53 PM Victor Savage RVT
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
## 2022-10-01 NOTE — ED Notes (Signed)
This NT approached pt and asked if I could check the pt VS. Pt responded with "no you can go check your own VS, Im gonna die out here." SORT RN notified.

## 2022-10-03 ENCOUNTER — Emergency Department (HOSPITAL_COMMUNITY)
Admission: EM | Admit: 2022-10-03 | Discharge: 2022-10-03 | Disposition: A | Discharge status: Home / Self Care | Payer: Medicare HMO | Attending: Emergency Medicine | Admitting: Emergency Medicine

## 2022-10-03 ENCOUNTER — Other Ambulatory Visit: Payer: Self-pay

## 2022-10-03 ENCOUNTER — Encounter (HOSPITAL_COMMUNITY): Payer: Self-pay | Admitting: *Deleted

## 2022-10-03 DIAGNOSIS — R21 Rash and other nonspecific skin eruption: Secondary | ICD-10-CM | POA: Insufficient documentation

## 2022-10-03 DIAGNOSIS — M79606 Pain in leg, unspecified: Secondary | ICD-10-CM | POA: Insufficient documentation

## 2022-10-03 MED ORDER — TRIAMCINOLONE ACETONIDE 0.1 % EX CREA
TOPICAL_CREAM | Freq: Two times a day (BID) | CUTANEOUS | Status: DC
  Filled 2022-10-03: qty 454

## 2022-10-03 MED ORDER — DEXAMETHASONE 4 MG PO TABS
10.0000 mg | ORAL_TABLET | Freq: Once | ORAL | Status: AC
  Administered 2022-10-03: 10 mg via ORAL
  Filled 2022-10-03: qty 3

## 2022-10-03 NOTE — ED Provider Notes (Signed)
[facility] Provider Note   CSN: [id number] Arrival date & time: 10/03/22  1321     History  Chief Complaint  Patient presents with   Leg Pain   Rash    Victor Savage is a 67 y.o. male.  67 yo M with a chief complaints rash to his belt line into his elbows.  This been going on for some time.  He was recently seen in the ER and was prescribed steroids both topical and oral but he was unable to get them filled and is back to see if he can get the medicine in some other way.  He also has chronic leg pain that he is can to see the pain clinic in a week or 2.  He denies significant change to that.   Leg Pain Rash      Home Medications Prior to Admission medications   Medication Sig Start Date End Date Taking? Authorizing Provider  acetaminophen (TYLENOL) 500 MG tablet Take 1,000 mg by mouth every 6 (six) hours as needed for mild pain or headache.    [provider]  amLODipine (NORVASC) 5 MG tablet Take 1 tablet (5 mg total) by mouth daily. 10/13/19   Saverio Danker, PA-C  atenolol (TENORMIN) 25 MG tablet Take 25 mg by mouth 2 (two) times daily.    [provider]  celecoxib (CELEBREX) 200 MG capsule Take 1 capsule (200 mg total) by mouth 2 (two) times daily. 05/08/22   Margarita Mail, PA-C  diclofenac (VOLTAREN) 75 MG EC tablet Take 75 mg by mouth 2 (two) times daily.    [provider]  divalproex (DEPAKOTE ER) 500 MG 24 hr tablet Take 2,000 mg by mouth 2 (two) times daily.     [provider]  divalproex (DEPAKOTE ER) 500 MG 24 hr tablet Take 500 mg by mouth at bedtime.    [provider]  FLUoxetine (PROZAC) 20 MG capsule Take 40 mg by mouth daily.     [provider]  gabapentin (NEURONTIN) 100 MG capsule Take 2 capsules (200 mg total) by mouth 2 (two) times daily as needed. 10/13/19   Saverio Danker, PA-C  gabapentin (NEURONTIN) 300 MG capsule Take 1 capsule (300 mg total) by  mouth 2 (two) times daily for 15 days. 12/10/19 12/25/19  Marcello Fennel, PA-C  lidocaine (LIDODERM) 5 % Place 1 patch onto the skin daily. Remove & Discard patch within 12 hours or as directed by MD 05/04/22   Mickie Hillier, PA-C  predniSONE (DELTASONE) 20 MG tablet Take 2 tablets (40 mg total) by mouth daily. 10/01/22   Noemi Chapel, MD  QUEtiapine (SEROQUEL) 200 MG tablet Take 400 mg by mouth at bedtime.    [provider]  QUEtiapine (SEROQUEL) 50 MG tablet Take 50 mg by mouth 2 (two) times daily.     [provider]  triamcinolone cream (KENALOG) 0.1 % Apply 1 Application topically 2 (two) times daily. 10/01/22   Noemi Chapel, MD      Allergies    Patient has no known allergies.    Review of Systems   Review of Systems  Skin:  Positive for rash.    Physical Exam Updated Vital Signs BP 109/68   Pulse 80   Temp 98.2 F (36.8 C) (Oral)   Resp 16   Ht 6' (1.829 m)   Wt 71.7 kg   SpO2 99%   BMI 21.43 kg/m  Physical Exam Vitals and  nursing note reviewed.  Constitutional:      Appearance: He is well-developed.  HENT:     Head: Normocephalic and atraumatic.  Eyes:     Pupils: Pupils are equal, round, and reactive to light.  Neck:     Vascular: No JVD.  Cardiovascular:     Rate and Rhythm: Normal rate and regular rhythm.     Heart sounds: No murmur heard.    No friction rub. No gallop.  Pulmonary:     Effort: No respiratory distress.     Breath sounds: No wheezing.  Abdominal:     General: There is no distension.     Tenderness: There is no abdominal tenderness. There is no guarding or rebound.  Musculoskeletal:        General: Normal range of motion.     Cervical back: Normal range of motion and neck supple.  Skin:    Coloration: Skin is not pale.     Findings: Rash present.     Comments: Hyperpigmented and scaling rash to the belt line.  No appreciable satellite lesions.  Also has a similar rash on the extensor surface of the elbows  bilaterally.  Neurological:     Mental Status: He is alert and oriented to person, place, and time.  Psychiatric:        Behavior: Behavior normal.     ED Results / Procedures / Treatments   Labs (all labs ordered are listed, but only abnormal results are displayed) Labs Reviewed - No data to display  EKG None  Radiology VAS [place] LOWER EXTREMITY VENOUS (DVT) (7a-7p)  Result Date: 10/01/2022  Lower Venous DVT Study Patient Name:  ERLON GASBARRO  Date of Exam:   10/01/2022 Medical Rec #: [id number]       Accession #:    [id number] Date of Birth: 09-Nov-1955       Patient Gender: M Patient Age:   53 years Exam Location:  [facility] Procedure:      VAS [place] LOWER EXTREMITY VENOUS (DVT) Referring Phys: MADISON REDWINE --------------------------------------------------------------------------------  Indications: Swelling.  Risk Factors: None identified. Comparison Study: No prior studies. Performing Technologist: Oliver Hum RVT  Examination Guidelines: A complete evaluation includes B-mode imaging, spectral Doppler, color Doppler, and power Doppler as needed of all accessible portions of each vessel. Bilateral testing is considered an integral part of a complete examination. Limited examinations for reoccurring indications may be performed as noted. The reflux portion of the exam is performed with the patient in reverse Trendelenburg.  +-----+---------------+---------+-----------+----------+--------------+ RIGHTCompressibilityPhasicitySpontaneityPropertiesThrombus Aging +-----+---------------+---------+-----------+----------+--------------+ CFV  Full           Yes      Yes                                 +-----+---------------+---------+-----------+----------+--------------+   +---------+---------------+---------+-----------+----------+--------------+ LEFT     CompressibilityPhasicitySpontaneityPropertiesThrombus Aging  +---------+---------------+---------+-----------+----------+--------------+ CFV      Full           Yes      Yes                                 +---------+---------------+---------+-----------+----------+--------------+ SFJ      Full                                                        +---------+---------------+---------+-----------+----------+--------------+  FV Prox  Full                                                        +---------+---------------+---------+-----------+----------+--------------+ FV Mid   Full                                                        +---------+---------------+---------+-----------+----------+--------------+ FV DistalFull                                                        +---------+---------------+---------+-----------+----------+--------------+ PFV      Full                                                        +---------+---------------+---------+-----------+----------+--------------+ POP      Full           Yes      Yes                                 +---------+---------------+---------+-----------+----------+--------------+ PTV      Full                                                        +---------+---------------+---------+-----------+----------+--------------+ PERO     Full                                                        +---------+---------------+---------+-----------+----------+--------------+     Summary: RIGHT: - No evidence of common femoral vein obstruction.  LEFT: - There is no evidence of deep vein thrombosis in the lower extremity.  - No cystic structure found in the popliteal fossa.  *See table(s) above for measurements and observations. Electronically signed by Orlie Pollen on 10/01/2022 at 5:39:46 PM.    Final     Procedures Procedures    Medications Ordered in ED Medications  triamcinolone cream (KENALOG) 0.1 % cream (has no administration in time range)  dexamethasone  (DECADRON) tablet 10 mg (has no administration in time range)    ED Course/ Medical Decision Making/ A&P                             Medical Decision Making Risk Prescription drug management.   67 yo M with a chief complaint of a rash to his beltline and to his elbows.  I think most likely this is eczema.  He was seen  by our ED about 48 hours ago.  Was prescribed topical and oral steroids.  He is unable to get them filled.  Alternatively I will give him an oral dose of Decadron here which should last him a couple days send I discussed with the pharmacist the ability to give him a medicine to take home with him.  Arranged for him a tub of triamcinolone cream.  PCP follow-up.  2:40 PM:  I have discussed the diagnosis/risks/treatment options with the patient.  Evaluation and diagnostic testing in the emergency department does not suggest an emergent condition requiring admission or immediate intervention beyond what has been performed at this time.  They will follow up with PCP. We also discussed returning to the ED immediately if new or worsening sx occur. We discussed the sx which are most concerning (e.g., sudden worsening pain, fever, inability to tolerate by mouth) that necessitate immediate return. Medications administered to the patient during their visit and any new prescriptions provided to the patient are listed below.  Medications given during this visit Medications  triamcinolone cream (KENALOG) 0.1 % cream (has no administration in time range)  dexamethasone (DECADRON) tablet 10 mg (has no administration in time range)     The patient appears reasonably screen and/or stabilized for discharge and I doubt any other medical condition or other [facility] requiring further screening, evaluation, or treatment in the ED at this time prior to discharge.          Final Clinical Impression(s) / ED Diagnoses Final diagnoses:  Rash    Rx / DC Orders ED Discharge Orders     None          Deno Etienne, DO 10/03/22 1440

## 2022-10-03 NOTE — ED Provider Triage Note (Signed)
Emergency Medicine Provider Triage Evaluation Note  Victor Savage , a 67 y.o. male  was evaluated in triage.  Pt complains of rash to lower abdomen and left leg pain.  Was seen for same 2 days ago and prescribed topical and systemic steroid.  States he has been unable to get this medication because it is typically shipped to his house but was told that these medications could not be shipped to his house.  Reports no change in his symptoms since 2 days ago.  Has been taking Tylenol for the left lower leg pain with no relief.  States he is out of bus passes and cannot go to any pharmacies  Review of Systems  Positive: As above  Negative: As above  Physical Exam  BP 109/68   Pulse 80   Temp 98.2 F (36.8 C) (Oral)   Resp 16   Ht 6' (1.829 m)   Wt 71.7 kg   SpO2 99%   BMI 21.43 kg/m  Gen:   Awake, no distress   Resp:  Normal effort  MSK:   Moves extremities without difficulty  Other:  Dry scaly rash to lower abdomen  Medical Decision Making  Medically screening exam initiated at 1:34 PM.  Appropriate orders placed.  Victor Savage was informed that the remainder of the evaluation will be completed by another provider, this initial triage assessment does not replace that evaluation, and the importance of remaining in the ED until their evaluation is complete.     Roylene Reason, [place] 10/03/22 1336

## 2022-10-03 NOTE — Discharge Instructions (Signed)
Follow up with your doctor in the office.  

## 2022-10-03 NOTE — ED Triage Notes (Signed)
Presents to ed via [facility] c/o bilateral leg pain and rash on his abd. On set 1 month ago, states he was seen at [facility] 3/14 was given a RX for prednisone and triamcinolene cream however it was never deliveried to him. States he stays at the [facility]

## 2023-04-15 ENCOUNTER — Other Ambulatory Visit: Payer: Self-pay

## 2023-04-15 ENCOUNTER — Encounter (HOSPITAL_COMMUNITY): Payer: Self-pay | Admitting: Emergency Medicine

## 2023-04-15 ENCOUNTER — Emergency Department (HOSPITAL_COMMUNITY): Payer: Medicare HMO

## 2023-04-15 ENCOUNTER — Emergency Department (HOSPITAL_COMMUNITY)
Admission: EM | Admit: 2023-04-15 | Discharge: 2023-04-15 | Disposition: A | Discharge status: Home / Self Care | Payer: Medicare HMO | Attending: Emergency Medicine | Admitting: Emergency Medicine

## 2023-04-15 DIAGNOSIS — S00502A Unspecified superficial injury of oral cavity, initial encounter: Secondary | ICD-10-CM | POA: Diagnosis present

## 2023-04-15 DIAGNOSIS — Z79899 Other long term (current) drug therapy: Secondary | ICD-10-CM | POA: Insufficient documentation

## 2023-04-15 DIAGNOSIS — S161XXA Strain of muscle, fascia and tendon at neck level, initial encounter: Secondary | ICD-10-CM | POA: Diagnosis not present

## 2023-04-15 DIAGNOSIS — S025XXA Fracture of tooth (traumatic), initial encounter for closed fracture: Secondary | ICD-10-CM

## 2023-04-15 DIAGNOSIS — Z23 Encounter for immunization: Secondary | ICD-10-CM | POA: Diagnosis not present

## 2023-04-15 DIAGNOSIS — S0181XA Laceration without foreign body of other part of head, initial encounter: Secondary | ICD-10-CM | POA: Diagnosis not present

## 2023-04-15 DIAGNOSIS — S0083XA Contusion of other part of head, initial encounter: Secondary | ICD-10-CM

## 2023-04-15 MED ORDER — TETANUS-DIPHTH-ACELL PERTUSSIS 5-2.5-18.5 LF-MCG/0.5 IM SUSY
0.5000 mL | PREFILLED_SYRINGE | Freq: Once | INTRAMUSCULAR | Status: AC
  Administered 2023-04-15: 0.5 mL via INTRAMUSCULAR
  Filled 2023-04-15: qty 0.5

## 2023-04-15 MED ORDER — HYDROCODONE-ACETAMINOPHEN 5-325 MG PO TABS
1.0000 | ORAL_TABLET | Freq: Once | ORAL | Status: AC
  Administered 2023-04-15: 1 via ORAL
  Filled 2023-04-15: qty 1

## 2023-04-15 MED ORDER — HYDROCODONE-ACETAMINOPHEN 5-325 MG PO TABS: 2.0000 | ORAL_TABLET | Freq: Once | ORAL | Status: DC
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
## 2023-04-15 NOTE — ED Notes (Signed)
Assumed care of pt. Pt asleep with visible rise and fall of chest. Awaiting dispo from provider.

## 2023-04-15 NOTE — ED Notes (Signed)
Patient transported to CT 
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
## 2023-04-15 NOTE — Discharge Instructions (Addendum)
Follow-up with your dentist as discussed.  Return to the emergency if you have any worsening symptoms.  Keep the wounds clean and dry.  Wash them daily with soap and water.
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
## 2023-04-30 NOTE — Congregational Nurse Program (Signed)
  Dept: [phone]   Congregational Nurse Program Note  Date of Encounter: 04/29/2023  Clinic visit for half inch cut on outer aspect of left thumb. Had come to [facility] for a meal, states he lives [address].  Discussed applying for a bed at [facility] after cleaning area with antiseptic wipe and applying a 1 x 3.5 band aid. Educated regarding keeping area clean to prevent infection.  Past Medical History: Past Medical History:  Diagnosis Date   Anxiety    Asthma    Cataract    bilateral   Decreased visual acuity    can only see well with right eye   Depression    Diabetes (HCC)    dka 09/2012    Diabetes mellitus without complication (HCC)    Dysrhythmia    ETOH abuse    Glaucoma    Headache(784.0)    Hepatitis C    Hepatitis C    Hypertension    Schizophrenia (HCC)    Schizophrenia, paranoid type (HCC)    follows at [facility]   Tobacco abuse     Encounter Details:  CNP Questionnaire - 04/29/23 1240       Questionnaire   Ask client: Do you give verbal consent for me to treat you today? Yes    Student Assistance N/A    Location Patient Served  [facility]    Visit Setting with Client Organization    Patient Status Unhoused    Insurance Medicare    Medication Have Medication Insecurities    Medical Provider Yes    Screening Referrals Made N/A    Medical Referrals Made N/A    Medical Appointment Made N/A    Recently w/o PCP, now 1st time PCP visit completed due to CNs referral or appointment made N/A    Food Have Food Insecurities    Transportation Need transportation assistance    Housing/Utilities No permanent housing;Referred to homeless shelter, day center    Interpersonal Safety Do not feel safe at current residence    Interventions Advocate/Support;Counsel;Educate    Abnormal to Normal Screening Since Last CN Visit N/A    Screenings CN Performed N/A    Sent Client to Lab for: N/A    Did client attend any of the following based off CNs referral  or appointments made? N/A    ED Visit Averted N/A    Life-Saving Intervention Made N/A

## 2023-07-29 IMAGING — DX DG CHEST 1V
1 series · 1 of 1 positions shown · non-contrast
Comparison: 09/29/2019

CLINICAL DATA: Cough

EXAM:
CHEST  1 VIEW

[chest ap]
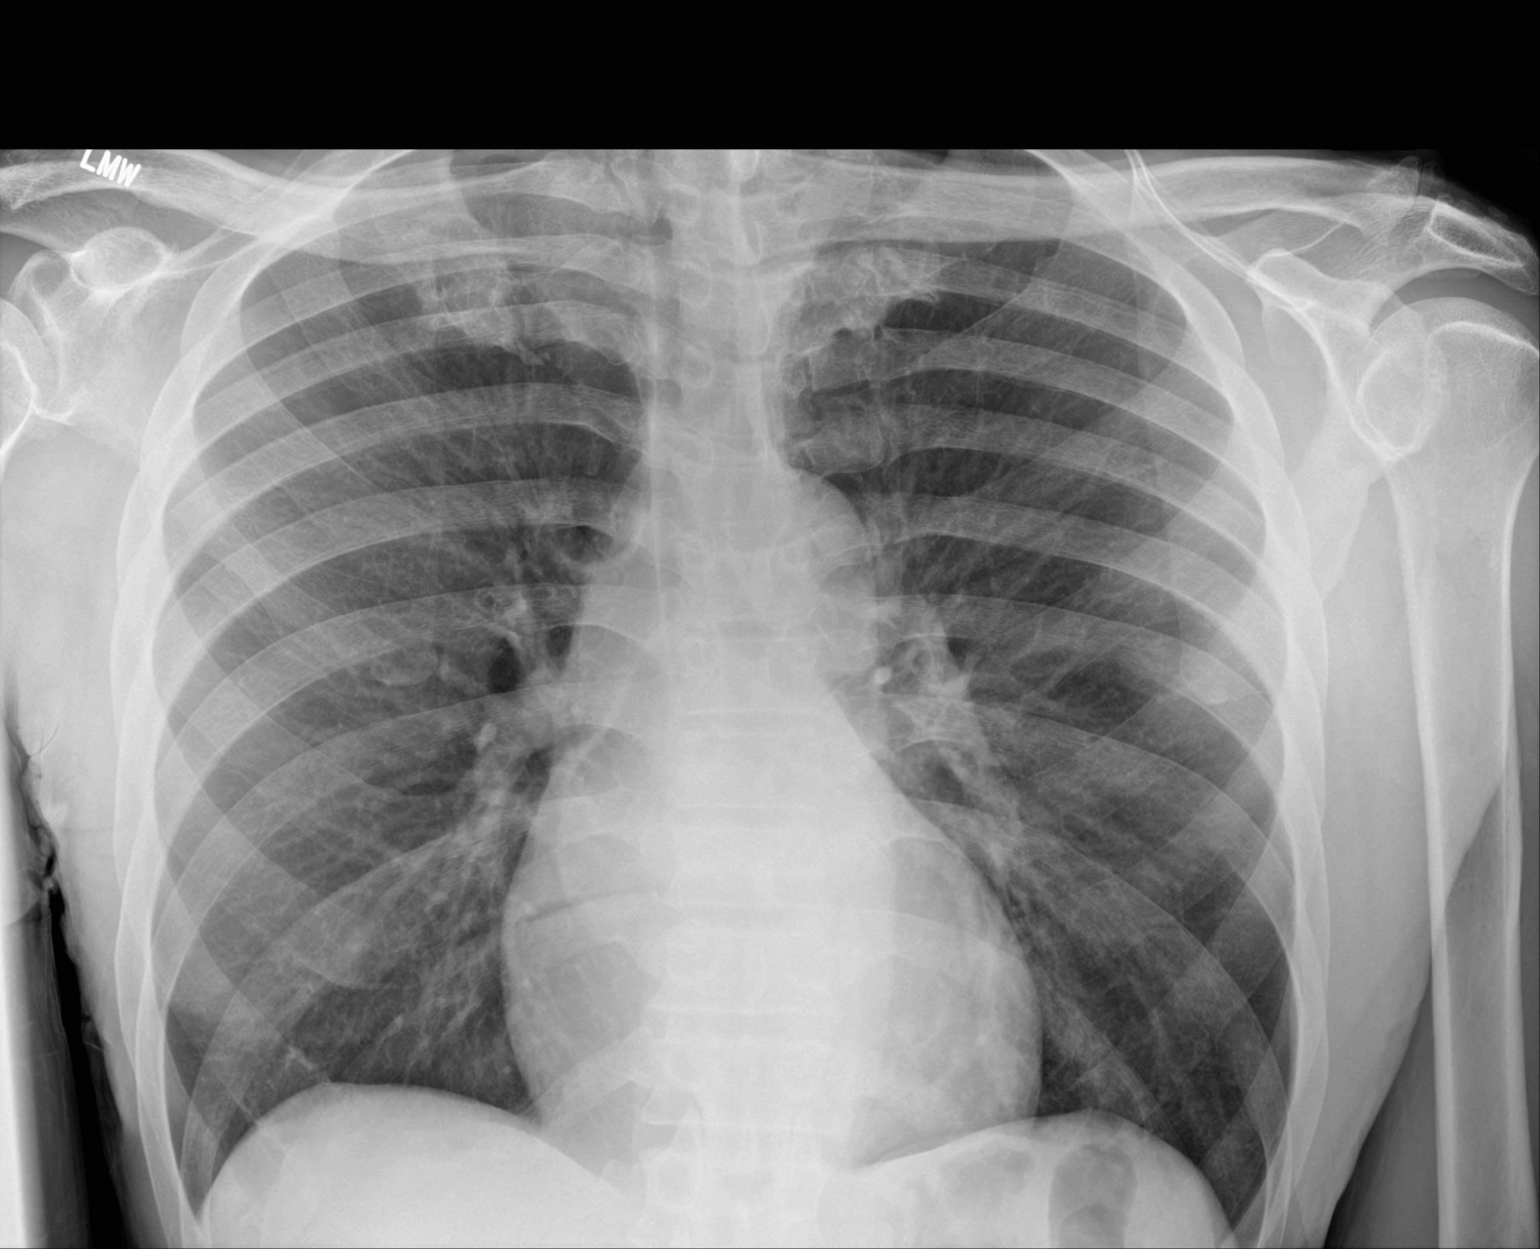

[1 of 1 positions shown; findings below may reference images not displayed]

FINDINGS: The heart size and mediastinal contours are within normal limits.
Both lungs are clear. The visualized skeletal structures are
unremarkable.
IMPRESSION: No active disease.

## 2023-08-09 NOTE — Congregational Nurse Program (Signed)
  Dept: [phone]   Congregational Nurse Program Note  Date of Encounter: 08/09/2023  Past Medical History: Past Medical History:  Diagnosis Date   Anxiety    Asthma    Cataract    bilateral   Decreased visual acuity    can only see well with right eye   Depression    Diabetes (HCC)    dka 09/2012    Diabetes mellitus without complication (HCC)    Dysrhythmia    ETOH abuse    Glaucoma    Headache(784.0)    Hepatitis C    Hepatitis C    Hypertension    Schizophrenia (HCC)    Schizophrenia, paranoid type (HCC)    follows at [facility]   Tobacco abuse     Encounter Details:  Community Questionnaire - 08/09/23 1118       Questionnaire   Ask client: Do you give verbal consent for me to treat you today? Yes    Student Assistance N/A    Location Patient Served  [facility]    Encounter Setting CN site    Population Status Unhoused    Insurance Medicaid    Insurance/Financial Assistance Referral N/A    Medication Have Medication Insecurities    Medical Provider Yes    Screening Referrals Made N/A    Medical Referrals Made Vision    Medical Appointment Completed N/A    CNP Interventions Advocate/Support;Navigate Healthcare System;Educate    Screenings CN Performed N/A    ED Visit Averted N/A    Life-Saving Intervention Made N/A            Victor Savage came to see RN for assistance with getting a vision appointment and getting new glasses. RN referred him to walk in vision clinic at [facility] on [address]. Directions given for bus and passes given. No further needs at this time.

## 2023-08-30 ENCOUNTER — Ambulatory Visit: Payer: 59 | Admitting: Physician Assistant

## 2023-08-30 ENCOUNTER — Encounter: Payer: Self-pay | Admitting: Physician Assistant

## 2023-08-30 DIAGNOSIS — M25512 Pain in left shoulder: Secondary | ICD-10-CM | POA: Diagnosis not present

## 2023-08-30 DIAGNOSIS — M533 Sacrococcygeal disorders, not elsewhere classified: Secondary | ICD-10-CM | POA: Diagnosis not present

## 2023-08-30 DIAGNOSIS — M545 Low back pain, unspecified: Secondary | ICD-10-CM | POA: Diagnosis not present

## 2023-08-30 MED ORDER — MELOXICAM 7.5 MG PO TABS: 7.5000 mg | ORAL_TABLET | Freq: Every day | ORAL | 0 refills | Status: AC

## 2023-08-30 MED ORDER — KETOROLAC TROMETHAMINE 60 MG/2ML IM SOLN
60.0000 mg | Freq: Once | INTRAMUSCULAR | Status: AC
  Administered 2023-08-30: 60 mg via INTRAMUSCULAR

## 2023-08-30 MED ORDER — MELOXICAM 7.5 MG PO TABS: 7.5000 mg | ORAL_TABLET | Freq: Every day | ORAL | 0 refills | Status: DC

## 2023-08-30 NOTE — Progress Notes (Signed)
 New Patient Office Visit  Subjective    Patient ID: Victor Savage, male    DOB: 05-08-1956  Age: 68 y.o. MRN: [id number]  CC:  Chief Complaint  Patient presents with   Fall    Pt states he slipped and fell down about 4 stairs at the last snow fall and hit his back and shoulder area. C/O left shoulder pain with decreased ROM. Denies LOC   Discussed the use of AI scribe software for clinical note transcription with the patient, who gave verbal consent to proceed.  History of Present Illness         The patient, with a history of hypertension presents with left shoulder and lower back pain following a fall on the stairs approximately a month ago. He landed on his left shoulder and back, and has been experiencing pain ever since, which has progressively worsened. The pain in the shoulder radiates down the arm and is associated with shaking  his arm. The lower back pain is localized to the low middle and tailbone area. Denis any change to urine or bowels. The patient has limited range of motion in the left shoulder, with pain on abduction and internal rotation. He has been taking Tylenol  1000mg  for pain relief, but it has not been effective. The patient also has a heart monitor in place for an unspecified cardiac condition. States that his PCP ordered it.  He is a poor historian, at one point states that he did have imaging completed already for this, but then believes it was several months ago.    Outpatient Encounter Medications as of 08/30/2023  Medication Sig   acetaminophen  (TYLENOL ) 500 MG tablet Take 1,000 mg by mouth every 6 (six) hours as needed for mild pain or headache.   [DISCONTINUED] meloxicam  (MOBIC ) 7.5 MG tablet Take 1 tablet (7.5 mg total) by mouth daily.   amLODipine  (NORVASC ) 5 MG tablet Take 1 tablet (5 mg total) by mouth daily. (Patient not taking: Reported on 08/30/2023)   atenolol (TENORMIN) 25 MG tablet Take 25 mg by mouth 2 (two) times daily. (Patient not taking:  Reported on 08/30/2023)   celecoxib  (CELEBREX ) 200 MG capsule Take 1 capsule (200 mg total) by mouth 2 (two) times daily. (Patient not taking: Reported on 08/30/2023)   diclofenac (VOLTAREN) 75 MG EC tablet Take 75 mg by mouth 2 (two) times daily. (Patient not taking: Reported on 08/30/2023)   divalproex  (DEPAKOTE  ER) 500 MG 24 hr tablet Take 2,000 mg by mouth 2 (two) times daily.  (Patient not taking: Reported on 08/30/2023)   divalproex  (DEPAKOTE  ER) 500 MG 24 hr tablet Take 500 mg by mouth at bedtime. (Patient not taking: Reported on 08/30/2023)   FLUoxetine  (PROZAC ) 20 MG capsule Take 40 mg by mouth daily.  (Patient not taking: Reported on 08/30/2023)   gabapentin  (NEURONTIN ) 100 MG capsule Take 2 capsules (200 mg total) by mouth 2 (two) times daily as needed. (Patient not taking: Reported on 08/30/2023)   gabapentin  (NEURONTIN ) 300 MG capsule Take 1 capsule (300 mg total) by mouth 2 (two) times daily for 15 days.   lidocaine  (LIDODERM ) 5 % Place 1 patch onto the skin daily. Remove & Discard patch within 12 hours or as directed by MD (Patient not taking: Reported on 08/30/2023)   meloxicam  (MOBIC ) 7.5 MG tablet Take 1 tablet (7.5 mg total) by mouth daily.   predniSONE  (DELTASONE ) 20 MG tablet Take 2 tablets (40 mg total) by mouth daily. (Patient not taking: Reported on 08/30/2023)  QUEtiapine  (SEROQUEL ) 200 MG tablet Take 400 mg by mouth at bedtime. (Patient not taking: Reported on 08/30/2023)   QUEtiapine  (SEROQUEL ) 50 MG tablet Take 50 mg by mouth 2 (two) times daily.  (Patient not taking: Reported on 08/30/2023)   triamcinolone  cream (KENALOG ) 0.1 % Apply 1 Application topically 2 (two) times daily. (Patient not taking: Reported on 08/30/2023)   [EXPIRED] ketorolac  (TORADOL ) injection 60 mg    No facility-administered encounter medications on file as of 08/30/2023.    Past Medical History:  Diagnosis Date   Anxiety    Asthma    Cataract    bilateral   Decreased visual acuity    can only see well  with right eye   Depression    Diabetes (HCC)    dka 09/2012    Diabetes mellitus without complication (HCC)    Dysrhythmia    ETOH abuse    Glaucoma    Headache(784.0)    Hepatitis C    Hepatitis C    Hypertension    Schizophrenia (HCC)    Schizophrenia, paranoid type (HCC)    follows at [facility]   Tobacco abuse     Past Surgical History:  Procedure Laterality Date   FASCIOTOMY Left 09/28/2019   Procedure: Four compartment Fasciotomy left lower leg;  Surgeon: Mayo Speck, MD;  Location: [facility] OR;  Service: Vascular;  Laterality: Left;   FEMORAL-POPLITEAL BYPASS GRAFT Left 09/28/2019   Procedure: LEFT FEMORAL-BELOW KNEE POPLITEAL ARTERY BYPASS WITH VEIN;  Surgeon: Mayo Speck, MD;  Location: [facility] OR;  Service: Vascular;  Laterality: Left;   HIP ARTHROPLASTY     INSERTION OF TRACTION PIN Left 09/28/2019   Procedure: Insertion Of Traction Pin left tibia;  Surgeon: Wes Hamman, MD;  Location: [facility] OR;  Service: Orthopedics;  Laterality: Left;   KNEE CARTILAGE SURGERY Right    ORIF FEMUR FRACTURE Left 09/28/2019   Procedure: OPEN REDUCTION INTERNAL FIXATION (ORIF) DISTAL FEMUR FRACTURE;  Surgeon: Wes Hamman, MD;  Location: [facility] OR;  Service: Orthopedics;  Laterality: Left;   R rear finger     TOTAL HIP ARTHROPLASTY Bilateral 2004, 2006   2/2 injury from fall   VEIN HARVEST Right 09/28/2019   Procedure: Right greater saphenous Vein Harvest;  Surgeon: Mayo Speck, MD;  Location: [facility] OR;  Service: Vascular;  Laterality: Right;    Family History  Problem Relation Age of Onset   Coronary artery disease Sister     Social History   Socioeconomic History   Marital status: Divorced    Spouse name: Not on file   Number of children: 1   Years of education: GED   Highest education level: Not on file  Occupational History   Occupation: Unemployed  Tobacco Use   Smoking status: Every Day    Current packs/day: 0.50    Average packs/day: 0.5 packs/day for 40.0 years (20.0 ttl pk-yrs)     Types: Cigarettes   Smokeless tobacco: Former  Substance and Sexual Activity   Alcohol  use: Yes    Comment: occ   Drug use: Yes    Types: Marijuana    Comment: previously used to snort cocaine in 1990s, remote THC useage   Sexual activity: Not on file  Other Topics Concern   Not on file  Social History Narrative   ** Merged History Encounter **       Homeless almost all of his life. Was incarcerated in 2012-2013 x8 mo for assault - released April 2013.  Can read and write minimally.   Has food stamps.   Pending disability    Previous job in [employer], Surveyor, mining          Social Drivers of Health   Financial Resource Strain: Not on file  Food Insecurity: Not on file  Transportation Needs: Not on file  Physical Activity: Not on file  Stress: Not on file  Social Connections: Not on file  Intimate Partner Violence: Not on file    Review of Systems  Constitutional: Negative.   HENT: Negative.    Eyes: Negative.   Respiratory:  Negative for shortness of breath.   Cardiovascular:  Negative for chest pain.  Gastrointestinal:  Negative for diarrhea.  Genitourinary:  Negative for dysuria, frequency and hematuria.  Musculoskeletal:  Positive for back pain and joint pain.  Skin: Negative.   Neurological: Negative.   Endo/Heme/Allergies: Negative.   Psychiatric/Behavioral: Negative.          Objective    BP 103/69 (BP Location: Left Arm, Patient Position: Sitting)   Pulse 88   Temp 97.7 F (36.5 C) (Temporal)   Ht 6' (1.829 m)   Wt 159 lb (72.1 kg)   SpO2 100%   BMI 21.56 kg/m   Physical Exam Vitals and nursing note reviewed.  Constitutional:      Appearance: Normal appearance.  HENT:     Head: Normocephalic and atraumatic.     Right Ear: External ear normal.     Left Ear: External ear normal.     Nose: Nose normal.     Mouth/Throat:     Mouth: Mucous membranes are moist.     Pharynx: Oropharynx is clear.  Eyes:     Extraocular Movements:  Extraocular movements intact.     Conjunctiva/sclera: Conjunctivae normal.     Pupils: Pupils are equal, round, and reactive to light.  Cardiovascular:     Rate and Rhythm: Normal rate and regular rhythm.     Pulses: Normal pulses.     Heart sounds: Normal heart sounds.  Pulmonary:     Effort: Pulmonary effort is normal.     Breath sounds: Normal breath sounds.  Musculoskeletal:     Right shoulder: Normal.     Left shoulder: Tenderness present. No swelling. Decreased range of motion. Decreased strength. Normal pulse.       Arms:     Cervical back: Normal range of motion and neck supple.     Comments: Patient wearing heart monitor from PCP  Skin:    General: Skin is warm and dry.  Neurological:     General: No focal deficit present.     Mental Status: He is alert and oriented to person, place, and time.  Psychiatric:        Mood and Affect: Mood normal.        Behavior: Behavior normal.        Thought Content: Thought content normal.        Judgment: Judgment normal.       Assessment & Plan:   Problem List Items Addressed This Visit   None Visit Diagnoses       Acute midline low back pain without sciatica       Relevant Medications   meloxicam  (MOBIC ) 7.5 MG tablet   ketorolac  (TORADOL ) injection 60 mg (Completed)   Other Relevant Orders   DG Lumbar Spine Complete     Acute pain of left shoulder       Relevant Medications   meloxicam  (  MOBIC ) 7.5 MG tablet   ketorolac  (TORADOL ) injection 60 mg (Completed)   Other Relevant Orders   DG Shoulder Left     Pain in sacrum       Relevant Medications   meloxicam  (MOBIC ) 7.5 MG tablet   ketorolac  (TORADOL ) injection 60 mg (Completed)   Other Relevant Orders   DG Sacrum/Coccyx      1. Acute midline low back pain without sciatica Pain and limited range of motion following a fall approximately one month ago. Pain radiates down the arm. No loss of consciousness during the fall. No imaging done since the fall. -Order  shoulder [facility]ray at [facility]. -Prescribe Meloxicam  for inflammation and pain, to be sent to [facility]. - DG Lumbar Spine Complete; Future - meloxicam  (MOBIC ) 7.5 MG tablet; Take 1 tablet (7.5 mg total) by mouth daily.  Dispense: 30 tablet; Refill: 0 - ketorolac  (TORADOL ) injection 60 mg  2. Acute pain of left shoulder  - DG Shoulder Left; Future - meloxicam  (MOBIC ) 7.5 MG tablet; Take 1 tablet (7.5 mg total) by mouth daily.  Dispense: 30 tablet; Refill: 0 - ketorolac  (TORADOL ) injection 60 mg  3. Pain in sacrum  - DG Sacrum/Coccyx; Future - meloxicam  (MOBIC ) 7.5 MG tablet; Take 1 tablet (7.5 mg total) by mouth daily.  Dispense: 30 tablet; Refill: 0 - ketorolac  (TORADOL ) injection 60 mg   I have reviewed the patient's medical history (PMH, PSH, Social History, Family History, Medications, and allergies) , and have been updated if relevant. I spent 30 minutes reviewing chart and  face to face time with patient.   Return if symptoms worsen or fail to improve.   Etter Hermann Mayers, PA-C

## 2023-08-30 NOTE — Patient Instructions (Signed)
 VISIT SUMMARY:  You visited us  today due to pain in your left shoulder and lower back following a fall on the stairs about a month ago. The pain in your shoulder radiates down your arm and is associated with shaking, while the lower back pain is localized to your tailbone area. You have been taking Tylenol  for pain relief, but it has not been effective.  YOUR PLAN:  -LEFT SHOULDER PAIN: Left shoulder pain and limited range of motion following a fall. This condition involves pain that radiates down the arm and is associated with shaking. We will order a shoulder X-ray at [facility] to get a better look at the injury. You received a pain shot today, and we have prescribed Meloxicam  for inflammation and pain, which you can pick up at [facility].  -LOWER BACK PAIN: Lower back pain localized to the tailbone area following the same fall. This condition involves pain in the lower back, specifically around the tailbone. We will order a lower back X-ray at [facility] to assess the injury.  INSTRUCTIONS:  Please await the results of your X-rays. We will contact you with the results and discuss the next steps, which may include a referral to an orthopedic specialist if necessary.  Malcom Scriver, PA-C Physician Assistant [facility] [email or web address]  Shoulder Pain Many things can cause shoulder pain, including: An injury to the shoulder. Overuse of the shoulder. Arthritis. The source of the pain can be: Inflammation. An injury to the shoulder joint. An injury to a tendon, ligament, or bone. Follow these instructions at home: Pay attention to changes in your symptoms. Let your health care provider know about them. Follow these instructions to relieve your pain. If you have a removable sling: Wear the sling as told by your provider. Remove it only as told by your provider. Check the skin around the sling every day. Tell your  provider about any concerns. Loosen the sling if your fingers tingle, become numb, or become cold. Keep the sling clean. If the sling is not waterproof: Do not let it get wet. Remove it to shower or bathe. Move your arm as little as possible, but keep your hand moving to prevent swelling. Managing pain, stiffness, and swelling  If told, put ice on the painful area. If you have a removable sling or immobilizer, remove it as told by your provider. Put ice in a plastic bag. Place a towel between your skin and the bag. Leave the ice on for 20 minutes, 2-3 times a day. If your skin turns bright red, remove the ice right away to prevent skin damage. The risk of damage is higher if you cannot feel pain, heat, or cold. Move your fingers often to reduce stiffness and swelling. Squeeze a soft ball or a foam pad as much as possible. This helps to keep the shoulder from swelling. It also helps to strengthen the arm. General instructions Take over-the-counter and prescription medicines only as told by your provider. Exercise may help with pain management. Perform exercises if told by your provider. You may be referred to a physical therapist to help in your recovery process. Keep all follow-up visits in order to avoid any type of permanent shoulder disability or chronic pain problems. Contact a health care provider if: Your pain is not relieved with medicines. New pain develops in your arm, hand, or fingers. You loosen your sling and your arm, hand, or fingers remain tingly, numb, swollen, or painful. Get help right  away if: Your arm, hand, or fingers turn white or blue. This information is not intended to replace advice given to you by your health care provider. Make sure you discuss any questions you have with your health care provider. Document Revised: 02/06/2022 Document Reviewed: 02/06/2022 Elsevier Patient Education  2024 [employer].

## 2023-08-30 NOTE — Progress Notes (Signed)
Pt refused PHQ9 and GAD7.

## 2023-09-01 ENCOUNTER — Emergency Department (HOSPITAL_COMMUNITY)
Admission: EM | Admit: 2023-09-01 | Discharge: 2023-09-01 | Disposition: A | Discharge status: Home / Self Care | Payer: 59 | Attending: Emergency Medicine | Admitting: Emergency Medicine

## 2023-09-01 ENCOUNTER — Other Ambulatory Visit: Payer: Self-pay

## 2023-09-01 ENCOUNTER — Emergency Department (HOSPITAL_COMMUNITY): Payer: 59

## 2023-09-01 ENCOUNTER — Encounter (HOSPITAL_COMMUNITY): Payer: Self-pay

## 2023-09-01 DIAGNOSIS — M75112 Incomplete rotator cuff tear or rupture of left shoulder, not specified as traumatic: Secondary | ICD-10-CM | POA: Diagnosis not present

## 2023-09-01 DIAGNOSIS — Z79899 Other long term (current) drug therapy: Secondary | ICD-10-CM | POA: Insufficient documentation

## 2023-09-01 DIAGNOSIS — I10 Essential (primary) hypertension: Secondary | ICD-10-CM | POA: Diagnosis not present

## 2023-09-01 DIAGNOSIS — E119 Type 2 diabetes mellitus without complications: Secondary | ICD-10-CM | POA: Insufficient documentation

## 2023-09-01 DIAGNOSIS — M12812 Other specific arthropathies, not elsewhere classified, left shoulder: Secondary | ICD-10-CM

## 2023-09-01 DIAGNOSIS — M25512 Pain in left shoulder: Secondary | ICD-10-CM | POA: Diagnosis present

## 2023-09-01 MED ORDER — CYCLOBENZAPRINE HCL 10 MG PO TABS: 10.0000 mg | ORAL_TABLET | Freq: Two times a day (BID) | ORAL | 0 refills | Status: AC | PRN

## 2023-09-01 MED ORDER — KETOROLAC TROMETHAMINE 15 MG/ML IJ SOLN
30.0000 mg | Freq: Once | INTRAMUSCULAR | Status: AC
  Administered 2023-09-01: 30 mg via INTRAMUSCULAR
  Filled 2023-09-01: qty 2

## 2023-09-01 NOTE — Discharge Instructions (Addendum)
There is arthritis in your shoulder but also concerned that you may have a rotator cuff injury.  It will be important to follow-up with the specialist.  You were given a prescription for a muscle relaxer to see if that additionally will help with the pain but continue the meloxicam at this time.

## 2023-09-01 NOTE — ED Triage Notes (Signed)
Pt states he fell 2 months ago and has right shoulder pain. Denies hitting head and denies LOC.

## 2023-09-01 NOTE — ED Provider Notes (Signed)
[place] EMERGENCY DEPARTMENT AT [facility] Provider Note   CSN: [id number] Arrival date & time: 09/01/23  1144     History  Chief Complaint  Patient presents with   Victor Savage is a 68 y.o. male.  Patient is a 68 year old male with a history of hepatitis C, schizophrenia, hypertension, diabetes who is presenting today with ongoing left shoulder pain.  He reports it has been hurting for months and is only getting worse.  He was seen at a mobile clinic a few days ago and reports they gave him a shot and meloxicam but is not helping with the pain.  It aches all the time but to bend over and tie his shoe it is even worse.  He does report having a very physical job in the past and being left-handed but denies any prior surgeries to the shoulder and not think of anything he did to injure it.  He also reports that he would like treatment for his hepatitis C.  The history is provided by the patient.       Home Medications Prior to Admission medications   Medication Sig Start Date End Date Taking? Authorizing Provider  cyclobenzaprine (FLEXERIL) 10 MG tablet Take 1 tablet (10 mg total) by mouth 2 (two) times daily as needed for muscle spasms. 09/01/23  Yes Gwyneth Sprout, MD  acetaminophen (TYLENOL) 500 MG tablet Take 1,000 mg by mouth every 6 (six) hours as needed for mild pain or headache.    [provider]  amLODipine (NORVASC) 5 MG tablet Take 1 tablet (5 mg total) by mouth daily. Patient not taking: Reported on 08/30/2023 10/13/19   Barnetta Chapel, PA-C  atenolol (TENORMIN) 25 MG tablet Take 25 mg by mouth 2 (two) times daily. Patient not taking: Reported on 08/30/2023    [provider]  celecoxib (CELEBREX) 200 MG capsule Take 1 capsule (200 mg total) by mouth 2 (two) times daily. Patient not taking: Reported on 08/30/2023 05/08/22   Arthor Captain, PA-C  diclofenac (VOLTAREN) 75 MG EC tablet Take 75 mg by mouth 2 (two) times  daily. Patient not taking: Reported on 08/30/2023    [provider]  divalproex (DEPAKOTE ER) 500 MG 24 hr tablet Take 2,000 mg by mouth 2 (two) times daily.  Patient not taking: Reported on 08/30/2023    [provider]  divalproex (DEPAKOTE ER) 500 MG 24 hr tablet Take 500 mg by mouth at bedtime. Patient not taking: Reported on 08/30/2023    [provider]  FLUoxetine (PROZAC) 20 MG capsule Take 40 mg by mouth daily.  Patient not taking: Reported on 08/30/2023    [provider]  gabapentin (NEURONTIN) 100 MG capsule Take 2 capsules (200 mg total) by mouth 2 (two) times daily as needed. Patient not taking: Reported on 08/30/2023 10/13/19   Barnetta Chapel, PA-C  gabapentin (NEURONTIN) 300 MG capsule Take 1 capsule (300 mg total) by mouth 2 (two) times daily for 15 days. 12/10/19 12/25/19  Carroll Sage, PA-C  lidocaine (LIDODERM) 5 % Place 1 patch onto the skin daily. Remove & Discard patch within 12 hours or as directed by MD Patient not taking: Reported on 08/30/2023 05/04/22   Cristopher Peru, PA-C  meloxicam (MOBIC) 7.5 MG tablet Take 1 tablet (7.5 mg total) by mouth daily. 08/30/23   Mayers, Cari S, PA-C  predniSONE (DELTASONE) 20 MG tablet Take 2 tablets (40 mg total) by mouth daily. Patient not taking: Reported on  08/30/2023 10/01/22   Eber Hong, MD  QUEtiapine (SEROQUEL) 200 MG tablet Take 400 mg by mouth at bedtime. Patient not taking: Reported on 08/30/2023    [provider]  QUEtiapine (SEROQUEL) 50 MG tablet Take 50 mg by mouth 2 (two) times daily.  Patient not taking: Reported on 08/30/2023    [provider]  triamcinolone cream (KENALOG) 0.1 % Apply 1 Application topically 2 (two) times daily. Patient not taking: Reported on 08/30/2023 10/01/22   Eber Hong, MD      Allergies    Patient has no allergy information on record.    Review of Systems   Review of Systems  Physical Exam Updated Vital Signs BP 129/89 (BP  Location: Right Arm)   Pulse 77   Temp (!) 97.5 F (36.4 C) (Oral)   Resp 20   Ht 6' (1.829 m)   Wt 76.7 kg   SpO2 99%   BMI 22.92 kg/m  Physical Exam Vitals and nursing note reviewed.  Constitutional:      General: He is not in acute distress.    Appearance: He is well-developed.  HENT:     Head: Normocephalic and atraumatic.  Eyes:     Conjunctiva/sclera: Conjunctivae normal.     Pupils: Pupils are equal, round, and reactive to light.  Cardiovascular:     Rate and Rhythm: Normal rate and regular rhythm.     Heart sounds: No murmur heard. Pulmonary:     Effort: Pulmonary effort is normal. No respiratory distress.     Breath sounds: Normal breath sounds. No wheezing or rales.  Abdominal:     General: There is no distension.     Palpations: Abdomen is soft.     Tenderness: There is no abdominal tenderness. There is no guarding or rebound.  Musculoskeletal:        General: Tenderness present. Normal range of motion.     Cervical back: Normal range of motion and neck supple.     Comments: Significant pain with palpation of the shoulder without obvious deformity or swelling.  He has pain with shoulder internal and external rotation as well as abduction.  No notable weakness.  Skin:    General: Skin is warm and dry.     Findings: No erythema or rash.  Neurological:     Mental Status: He is alert and oriented to person, place, and time.  Psychiatric:        Behavior: Behavior normal.     ED Results / Procedures / Treatments   Labs (all labs ordered are listed, but only abnormal results are displayed) Labs Reviewed - No data to display  EKG None  Radiology DG Shoulder Left Result Date: 09/01/2023 CLINICAL DATA:  Left shoulder pain for 2 months. EXAM: LEFT SHOULDER - 2+ VIEW COMPARISON:  Left shoulder radiographs dated June 20, 2023. FINDINGS: There is no evidence of acute fracture or dislocation. Mild-to-moderate degenerative changes of the glenohumeral and  acromioclavicular joints. Soft tissues are grossly unremarkable. IMPRESSION: 1. No acute fracture or dislocation of the left shoulder. 2. Mild-to-moderate degenerative changes of the glenohumeral and acromioclavicular joints. Electronically Signed   By: Hart Robinsons M.D.   On: 09/01/2023 15:29    Procedures Procedures    Medications Ordered in ED Medications  ketorolac (TORADOL) 15 MG/ML injection 30 mg (30 mg Intramuscular Given 09/01/23 1250)    ED Course/ Medical Decision Making/ A&P  Medical Decision Making Amount and/or Complexity of Data Reviewed Radiology: ordered and independent interpretation performed. Decision-making details documented in ED Course.  Risk Prescription drug management.   Patient here today with complaint of shoulder pain.  No evidence to suggest septic arthritis.  Suspect rotator cuff issue and lower suspicion for bony abnormality.  Patient's symptoms are not classic for cardiac etiology.  Pain can be reproduced with palpation and movement of the shoulder and reports it has been going on for months.  Patient was given meloxicam 2 days ago but states is not helping.  Images were ordered but he never received them.  Patient also requesting treatment for hepatitis C but encouraged him to follow-up with his PCP and he would need to see them in GI if he wanted treatment for the hepatitis.  4:10 PM I have independently visualized and interpreted pt's images today.  X-ray today with evidence of some arthritis.  No other signs of bony injury.  Radiology reports mild to moderate degenerative changes in the Glenohumeral and acromioclavicular joints.  Will give follow-up with orthopedist for further evaluation and try muscle relaxer in addition to the meloxicam to see if that helps with his pain.         Final Clinical Impression(s) / ED Diagnoses Final diagnoses:  Rotator cuff arthropathy of left shoulder    Rx / DC  Orders ED Discharge Orders          Ordered    cyclobenzaprine (FLEXERIL) 10 MG tablet  2 times daily PRN        09/01/23 1609              Gwyneth Sprout, MD 09/01/23 1610

## 2023-10-11 ENCOUNTER — Ambulatory Visit: Admitting: Physician Assistant

## 2023-10-12 ENCOUNTER — Ambulatory Visit: Admitting: Physician Assistant

## 2023-10-14 ENCOUNTER — Telehealth: Payer: Self-pay

## 2023-10-14 NOTE — Telephone Encounter (Signed)
 Unsuccessful Telephone outreach, attempted to call patient to reschedule missed appointment. Unable to reach. Lvm to return call to reschedule.

## 2023-12-18 ENCOUNTER — Encounter (HOSPITAL_COMMUNITY): Payer: Self-pay

## 2023-12-18 ENCOUNTER — Other Ambulatory Visit: Payer: Self-pay

## 2023-12-18 ENCOUNTER — Emergency Department (HOSPITAL_COMMUNITY)
Admission: EM | Admit: 2023-12-18 | Discharge: 2023-12-18 | Disposition: A | Discharge status: Home / Self Care | Attending: Emergency Medicine | Admitting: Emergency Medicine

## 2023-12-18 DIAGNOSIS — M25562 Pain in left knee: Secondary | ICD-10-CM | POA: Diagnosis present

## 2023-12-18 DIAGNOSIS — M1710 Unilateral primary osteoarthritis, unspecified knee: Secondary | ICD-10-CM

## 2023-12-18 DIAGNOSIS — M1712 Unilateral primary osteoarthritis, left knee: Secondary | ICD-10-CM | POA: Diagnosis not present

## 2023-12-18 MED ORDER — NAPROXEN 375 MG PO TABS: 375.0000 mg | ORAL_TABLET | Freq: Two times a day (BID) | ORAL | 0 refills | Status: AC | PRN

## 2023-12-18 MED ORDER — HYDROCODONE-ACETAMINOPHEN 5-325 MG PO TABS: 1.0000 | ORAL_TABLET | Freq: Four times a day (QID) | ORAL | 0 refills | Status: AC | PRN

## 2023-12-18 MED ORDER — HYDROCODONE-ACETAMINOPHEN 5-325 MG PO TABS
1.0000 | ORAL_TABLET | Freq: Once | ORAL | Status: AC
  Administered 2023-12-18: 1 via ORAL
  Filled 2023-12-18: qty 1

## 2023-12-18 NOTE — Discharge Instructions (Signed)
Contact a health care provider if: You have redness, swelling, or a feeling of warmth in a joint that gets worse. You have a fever along with joint or muscle aches. You develop a rash. You have trouble doing your normal activities. You have pain that gets worse and is not relieved by pain medicine.
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
## 2023-12-18 NOTE — ED Triage Notes (Addendum)
 Pt BIB EMS from [facility]. Pt complains of left knee pain x several months. Pt reports just needing pain medication. Pt reports falling yesterday due to his knee pain. No injuries.
